# Patient Record
Sex: Female | Born: 1970 | Race: White | Hispanic: No | Marital: Married | Smoking: Current every day smoker
Health system: Southern US, Community
[De-identification: ages and names within clinical notes are randomized; demographics above are authoritative.]

## PROBLEM LIST (undated history)

## (undated) DIAGNOSIS — H539 Unspecified visual disturbance: Secondary | ICD-10-CM

## (undated) DIAGNOSIS — J45909 Unspecified asthma, uncomplicated: Secondary | ICD-10-CM

## (undated) DIAGNOSIS — R55 Syncope and collapse: Secondary | ICD-10-CM

## (undated) HISTORY — PX: DILATION AND CURETTAGE OF UTERUS: SHX78

## (undated) HISTORY — DX: Unspecified visual disturbance: H53.9

## (undated) HISTORY — PX: TUBAL LIGATION: SHX77

## (undated) HISTORY — DX: Syncope and collapse: R55

---

## 2016-05-04 ENCOUNTER — Encounter (HOSPITAL_BASED_OUTPATIENT_CLINIC_OR_DEPARTMENT_OTHER): Payer: Self-pay

## 2016-05-04 ENCOUNTER — Emergency Department (HOSPITAL_BASED_OUTPATIENT_CLINIC_OR_DEPARTMENT_OTHER): Payer: Self-pay

## 2016-05-04 ENCOUNTER — Emergency Department (HOSPITAL_BASED_OUTPATIENT_CLINIC_OR_DEPARTMENT_OTHER)
Admission: EM | Admit: 2016-05-04 | Discharge: 2016-05-05 | Disposition: A | Discharge status: Home / Self Care | Payer: Self-pay | Attending: Emergency Medicine | Admitting: Emergency Medicine

## 2016-05-04 DIAGNOSIS — R079 Chest pain, unspecified: Secondary | ICD-10-CM | POA: Insufficient documentation

## 2016-05-04 DIAGNOSIS — J45909 Unspecified asthma, uncomplicated: Secondary | ICD-10-CM | POA: Insufficient documentation

## 2016-05-04 DIAGNOSIS — F17210 Nicotine dependence, cigarettes, uncomplicated: Secondary | ICD-10-CM | POA: Insufficient documentation

## 2016-05-04 DIAGNOSIS — Z79899 Other long term (current) drug therapy: Secondary | ICD-10-CM | POA: Insufficient documentation

## 2016-05-04 HISTORY — DX: Unspecified asthma, uncomplicated: J45.909

## 2016-05-04 LAB — BASIC METABOLIC PANEL
ANION GAP: 6 (ref 5–15)
BUN: 7 mg/dL (ref 6–20)
CHLORIDE: 106 mmol/L (ref 101–111)
CO2: 26 mmol/L (ref 22–32)
Calcium: 9 mg/dL (ref 8.9–10.3)
Creatinine, Ser: 0.91 mg/dL (ref 0.44–1.00)
GFR calc Af Amer: >60 mL/min (ref >60)
Glucose, Bld: 104 mg/dL — ABNORMAL HIGH (ref 65–99)
POTASSIUM: 3.8 mmol/L (ref 3.5–5.1)
SODIUM: 138 mmol/L (ref 135–145)

## 2016-05-04 LAB — CBC
HEMATOCRIT: 39.2 % (ref 36.0–46.0)
HEMOGLOBIN: 13.3 g/dL (ref 12.0–15.0)
MCH: 32.8 pg (ref 26.0–34.0)
MCHC: 33.9 g/dL (ref 30.0–36.0)
MCV: 96.8 fL (ref 78.0–100.0)
Platelets: 274 10*3/uL (ref 150–400)
RBC: 4.05 MIL/uL (ref 3.87–5.11)
RDW: 12.6 % (ref 11.5–15.5)
WBC: 6.1 10*3/uL (ref 4.0–10.5)

## 2016-05-04 LAB — TROPONIN I: Troponin I: <0.03 ng/mL (ref <0.03)

## 2016-05-04 LAB — PREGNANCY, URINE: PREG TEST UR: NEGATIVE

## 2016-05-04 MED ORDER — IOPAMIDOL (ISOVUE-370) INJECTION 76%
100.0000 mL | Freq: Once | INTRAVENOUS | Status: AC | PRN
  Administered 2016-05-04: 100 mL via INTRAVENOUS

## 2016-05-04 MED ORDER — SODIUM CHLORIDE 0.9 % IV BOLUS (SEPSIS)
1000.0000 mL | Freq: Once | INTRAVENOUS | Status: AC
  Administered 2016-05-04: 1000 mL via INTRAVENOUS

## 2016-05-04 NOTE — ED Notes (Signed)
Mother reports that pts pain is coming back at this time. MD made aware. New orders received for repeat EKG at this time.

## 2016-05-04 NOTE — ED Notes (Signed)
L side chest pain started around 430 while cooking dinner, describes pain as non radiating, aching, and burning. Denies SOB, N/V.

## 2016-05-04 NOTE — ED Provider Notes (Signed)
[facility]   CSN: [id number] Arrival date & time: 05/04/16  1904  By signing my name below, I, Hannah Andersen, attest that this documentation has been prepared under the direction and in the presence of Pricilla Loveless, MD. Electronically Signed: Gillis Andersen. Hannah Andersen, ED Scribe. 05/04/16. 7:42 PM.  History   Chief Complaint Chief Complaint  Patient presents with  . Chest Pain    The history is provided by the patient. No language interpreter was used.  Chest Pain  Associated symptoms include shortness of breath. Pertinent negatives include no back pain, no cough, no diaphoresis, no headaches, no nausea, no numbness, no vomiting and no weakness.   HPI Comments: Hannah Andersen is a 45 y.o. female with PMHx of GERD who presents to the Emergency Department complaining of sudden onset, constant, sharp, central chest-pain x 3 hrs. Pain does not radiate but is more present on the left-side of her chest than the right. She reports that she was cooking dinner when pain started. Pt has associated shortness of breath and slurred speech which lasted for . Shortness of breath has partially resolved while in [facility]. She states that neck pain was present but now has resolved. She has taken Ibuprofen for pain PTA. Pain is exacerbated upon applying pressure to her chest. Pt is a current smoker but has not smoked any cigarettes today because she has had "trouble breathing all day." She reports that her grandmother had an MI in her mid 31's. Pt notes that menstrual ended on 05/04/16 and was normal. Denies any headache, cough, back pain, arm pain, diaphoresis, leg pain, numbness, leg swelling, nausea, or vomiting,  Past Medical History:  Diagnosis Date  . Asthma     There are no active problems to display for this patient.   Past Surgical History:  Procedure Laterality Date  . DILATION AND CURETTAGE OF UTERUS    . TUBAL LIGATION      OB History    No data  available       Home Medications    Prior to Admission medications   Medication Sig Start Date End Date Taking? Authorizing Provider  Omeprazole (PRILOSEC PO) Take by mouth.   Yes Historical Provider, MD    Family History No family history on file.  Social History Social History  Substance Use Topics  . Smoking status: Current Every Day Smoker    Types: Cigarettes  . Smokeless tobacco: Never Used  . Alcohol use No     Allergies   Review of patient's allergies indicates no known allergies.   Review of Systems Review of Systems  Constitutional: Negative for diaphoresis.  Respiratory: Positive for shortness of breath. Negative for cough.   Cardiovascular: Positive for chest pain. Negative for leg swelling.  Gastrointestinal: Negative for nausea and vomiting.  Musculoskeletal: Negative for arthralgias, back pain and myalgias.  Neurological: Positive for speech difficulty. Negative for weakness, numbness and headaches.  All other systems reviewed and are negative.  Physical Exam Updated Vital Signs BP 116/74   Pulse 80   Temp 98.6 F (37 C) (Oral)   Resp 18   Ht 5\' 1"  (1.549 m)   Wt 168 lb (76.2 kg)   LMP 05/01/2016   SpO2 100%   BMI 31.74 kg/m   Physical Exam  Constitutional: She is oriented to person, place, and time. She appears well-developed and well-nourished.  HENT:  Head: Normocephalic and atraumatic.  Right Ear: External ear normal.  Left Ear: External ear normal.  Nose: Nose normal.  Eyes: Right eye exhibits no discharge. Left eye exhibits no discharge.  Cardiovascular: Normal rate, regular rhythm and normal heart sounds.   Pulses:      Radial pulses are 2+ on the right side, and 2+ on the left side.       Dorsalis pedis pulses are 2+ on the right side, and 2+ on the left side.  HR around 100.   Pulmonary/Chest: Effort normal and breath sounds normal. She exhibits tenderness.    Abdominal: Soft. There is no tenderness.  Neurological: She is  alert and oriented to person, place, and time.  No slurred speech. 5/5 strength in all 4 extremities. Grossly normal sensation.   Skin: Skin is warm and dry.  Nursing note and vitals reviewed.  ED Treatments / Results  DIAGNOSTIC STUDIES: Oxygen Saturation is 100% on RA, normal by my interpretation.    COORDINATION OF CARE: 7:36 PM-Discussed treatment plan which includes BMP, CBC, Troponin and IV Fluids with pt at bedside and pt agreed to plan.   Labs (all labs ordered are listed, but only abnormal results are displayed) Labs Reviewed  BASIC METABOLIC PANEL - Abnormal; Notable for the following:       Result Value   Glucose, Bld 104 (*)    All other components within normal limits  CBC  TROPONIN I  PREGNANCY, URINE  TROPONIN I   EKG  EKG Interpretation  Date/Time:  Wednesday May 04 2016 19:22:31 EDT Ventricular Rate:  85 PR Interval:    QRS Duration: 84 QT Interval:  368 QTC Calculation: 438 R Axis:   53 Text Interpretation:  Sinus rhythm Normal ECG No old tracing to compare Confirmed by Azelyn Batie MD, Meziah Blasingame [phone]([id number]) on 05/04/2016 7:40:33 PM       EKG Interpretation  Date/Time:  Wednesday May 04 2016 22:51:33 EDT Ventricular Rate:  79 PR Interval:    QRS Duration: 90 QT Interval:  382 QTC Calculation: 438 R Axis:   52 Text Interpretation:  Sinus rhythm Low voltage, precordial leads Abnormal R-wave progression, early transition no acute ST/T changes no significant change since earlier in the day Confirmed by Hannah Carra MD, Hannah Andersen [phone]([id number]) on 05/04/2016 11:11:10 PM       Radiology Dg Chest 2 View  Result Date: 05/04/2016 CLINICAL DATA:  Sudden onset of mid chest pain beginning 4 hours ago. EXAM: CHEST  2 VIEW COMPARISON:  None. FINDINGS: The heart size is normal. The lung volumes are low. No focal airspace disease is present. There is no edema or effusion suggest failure. Mild rightward curvature is present in the mid thoracic spine. IMPRESSION: Negative two view  chest x-ray Electronically Signed   By: Hannah Andersen  Mattern M.D.   On: 05/04/2016 20:07   Ct Angio Chest Aorta W And/or Wo Contrast  Result Date: 05/04/2016 CLINICAL DATA:  Chest pain today EXAM: CT ANGIOGRAPHY CHEST, ABDOMEN AND PELVIS TECHNIQUE: Multidetector CT imaging through the chest, abdomen and pelvis was performed using the standard protocol during bolus administration of intravenous contrast. Multiplanar reconstructed images and MIPs were obtained and reviewed to evaluate the vascular anatomy. CONTRAST:  100 cc Isovue 370 COMPARISON:  None. FINDINGS: CTA CHEST FINDINGS There is no evidence of aortic dissection, aneurysm, or intramural hematoma. Great vessels patent within the confines of the exam. No obvious acute pulmonary thromboembolism. Small mediastinal nodes. No abnormal adenopathy by measurement criteria. No pneumothorax.  No pleural effusion No acute bony deformity. Review of the MIP images confirms the above findings. CTA ABDOMEN AND  PELVIS FINDINGS No evidence of aortic aneurysm or dissection. The aorta is non aneurysmal and patent. Celiac patent SMA patent IMA patent Single renal arteries patent Common, internal, and external iliac arteries patent Proximal femoral artery is patent. Liver, gallbladder, spleen, pancreas, adrenal glands, and kidneys are within normal limits Normal appendix. No focal mass of the colon. No evidence of bowel obstruction. Uterus is prominent without obvious focal mass. Adnexa and bladder are within normal limits. No free-fluid.  No abnormal retroperitoneal adenopathy. No vertebral compression deformity. Review of the MIP images confirms the above findings. IMPRESSION: No of a of aortic dissection. No active cardiopulmonary disease No acute intra-abdominal pathology. Electronically Signed   By: Jolaine Click M.D.   On: 05/04/2016 22:09   Ct Angio Abd/pel W/ And/or W/o  Result Date: 05/04/2016 CLINICAL DATA:  Chest pain today EXAM: CT ANGIOGRAPHY CHEST, ABDOMEN  AND PELVIS TECHNIQUE: Multidetector CT imaging through the chest, abdomen and pelvis was performed using the standard protocol during bolus administration of intravenous contrast. Multiplanar reconstructed images and MIPs were obtained and reviewed to evaluate the vascular anatomy. CONTRAST:  100 cc Isovue 370 COMPARISON:  None. FINDINGS: CTA CHEST FINDINGS There is no evidence of aortic dissection, aneurysm, or intramural hematoma. Great vessels patent within the confines of the exam. No obvious acute pulmonary thromboembolism. Small mediastinal nodes. No abnormal adenopathy by measurement criteria. No pneumothorax.  No pleural effusion No acute bony deformity. Review of the MIP images confirms the above findings. CTA ABDOMEN AND PELVIS FINDINGS No evidence of aortic aneurysm or dissection. The aorta is non aneurysmal and patent. Celiac patent SMA patent IMA patent Single renal arteries patent Common, internal, and external iliac arteries patent Proximal femoral artery is patent. Liver, gallbladder, spleen, pancreas, adrenal glands, and kidneys are within normal limits Normal appendix. No focal mass of the colon. No evidence of bowel obstruction. Uterus is prominent without obvious focal mass. Adnexa and bladder are within normal limits. No free-fluid.  No abnormal retroperitoneal adenopathy. No vertebral compression deformity. Review of the MIP images confirms the above findings. IMPRESSION: No of a of aortic dissection. No active cardiopulmonary disease No acute intra-abdominal pathology. Electronically Signed   By: Jolaine Click M.D.   On: 05/04/2016 22:09    Procedures Procedures (including critical care time)  Medications Ordered in ED Medications  sodium chloride 0.9 % bolus 1,000 mL (0 mLs Intravenous Stopped 05/04/16 2057)  iopamidol (ISOVUE-370) 76 % injection 100 mL (100 mLs Intravenous Contrast Given 05/04/16 2142)   Initial Impression / Assessment and Plan / ED Course  I have reviewed the  triage vital signs and the nursing notes.  Pertinent labs & imaging results that were available during my care of the patient were reviewed by me and considered in my medical decision making (see chart for details).  Clinical Course    Patient persists with atypical, sharp chest left of midline chest pain. I this is likely musculoskeletal. However, she did present with transient slurred speech in the setting of her chest pain. Probably this was more anxiety related as she was very anxious on arrival. However she's not currently slurred. There is no dissection on CT scan. Has 2 negative troponins and 2 benign ECGs. Has declined pain medicine including ibuprofen in the ED. I have recommended she is ibuprofen at home and follow-up close it with her PCP. Her HEART score is a 1. Discussed return precautions.  Final Clinical Impressions(s) / ED Diagnoses   Final diagnoses:  Nonspecific chest pain  New Prescriptions New Prescriptions   No medications on file   I personally performed the services described in this documentation, which was scribed in my presence. The recorded information has been reviewed and is accurate.     Pricilla Loveless, MD 05/04/16 2330

## 2016-05-04 NOTE — ED Triage Notes (Signed)
C/o CP, SOB x 1.5 hours-presents to triage in w/c-NAD

## 2016-06-18 DIAGNOSIS — R930 Abnormal findings on diagnostic imaging of skull and head, not elsewhere classified: Secondary | ICD-10-CM | POA: Insufficient documentation

## 2016-06-18 DIAGNOSIS — R55 Syncope and collapse: Secondary | ICD-10-CM | POA: Insufficient documentation

## 2016-06-18 DIAGNOSIS — R51 Headache: Secondary | ICD-10-CM

## 2016-06-18 DIAGNOSIS — R519 Headache, unspecified: Secondary | ICD-10-CM | POA: Insufficient documentation

## 2016-07-19 ENCOUNTER — Other Ambulatory Visit: Payer: Self-pay | Admitting: *Deleted

## 2016-07-19 ENCOUNTER — Ambulatory Visit (INDEPENDENT_AMBULATORY_CARE_PROVIDER_SITE_OTHER): Payer: Self-pay | Admitting: Neurology

## 2016-07-19 ENCOUNTER — Encounter: Payer: Self-pay | Admitting: Neurology

## 2016-07-19 DIAGNOSIS — R9089 Other abnormal findings on diagnostic imaging of central nervous system: Secondary | ICD-10-CM | POA: Insufficient documentation

## 2016-07-19 DIAGNOSIS — R269 Unspecified abnormalities of gait and mobility: Secondary | ICD-10-CM | POA: Insufficient documentation

## 2016-07-19 DIAGNOSIS — M542 Cervicalgia: Secondary | ICD-10-CM

## 2016-07-19 DIAGNOSIS — R42 Dizziness and giddiness: Secondary | ICD-10-CM

## 2016-07-19 DIAGNOSIS — G4489 Other headache syndrome: Secondary | ICD-10-CM | POA: Insufficient documentation

## 2016-07-19 DIAGNOSIS — G4452 New daily persistent headache (NDPH): Secondary | ICD-10-CM

## 2016-07-19 DIAGNOSIS — F418 Other specified anxiety disorders: Secondary | ICD-10-CM

## 2016-07-19 MED ORDER — ESCITALOPRAM OXALATE 20 MG PO TABS: 20.0000 mg | ORAL_TABLET | Freq: Every day | ORAL | 5 refills | Status: DC

## 2016-07-19 MED ORDER — VALSARTAN 80 MG PO TABS: 80.0000 mg | ORAL_TABLET | Freq: Every day | ORAL | 1 refills | Status: DC

## 2016-07-19 NOTE — Progress Notes (Addendum)
[facility]  PATIENT: Hannah Andersen DOB: [birth year]  REFERRING DOCTOR OR PCP:  Dr. Jobe Marker SOURCE: patient, notes from ED/Dr. Trudie Buckler, studies at [facility]  _________________________________   HISTORICAL  CHIEF COMPLAINT:  Chief Complaint  Patient presents with  . Abnormal MRI    Hannah Andersen is here with her husband Dimas Aguas for eval of abnorma MRI.  Sts. about a month ago, she had a synopal episode while walking in her yard.  She was seen at [facility], where she sts. MRI brain showed a lesion.  "They told me they believed it was MS."  Sts. she has had intermittent dizziness since then, with h/a's. Also sts. she has a burning sensation scalp back of head/fim  . Dizziness  . Headaches    HISTORY OF PRESENT ILLNESS:  I had the pleasure seeing you patient, Hannah Andersen, at [facility] for neurologic consultation regarding her recent logic symptoms and her abnormal MRI and possibility of multiple sclerosis.  She is a 45 year old woman who had an episode of syncope that came on suddenly one month ago. At that time, she was experiencing a headache. The syncope was not preceded by lightheadedness.   HEENT to what witnesses have told her, she was unconscious for about 1 minute and then was groggy or not quite herself for the next 5 minutes. By the time she got to [facility] she felt that she was back to herself. In the emergency room, she had a head CT scan showing a hypodense focus in the right periatrial white matter. As admitted for further evaluation. An MRI of the brain showed that focus plus many others, some in the periventricular white matter. None of the foci enhanced after contrast. She also had an EEG that did not show any definite epileptiform activity.  In the past, she has had problems with headaches. These have been almost daily for the past 3 months. However, her headaches have been at least  intermittent since her mid 16s. The current more chronic headache is located in the temple region and the back of the head bilaterally. Generally, she wakes up without a headache in the headache builds up as the day goes on.  Over-the-counter medications like Excedrin Migraine take the edge off for an hour or 2 but then the headache returns to the same or worse intensity. Most of time, she will get nausea with the headaches and every now and then she will have vomiting.  She denies any difficulty with her gait. She has not noted problems with strength or weakness. In the past, she has not had episodes where there was reduced strength, sensation or coordination.   Note that she is generally clumsy but this is been a problem for many years.  She notes that she needs to wear reading glasses now. Sometimes her vision is blurry. However, there has not been an episode of time where one eye was significantly worse than the other for days a weeks.  She notes occasional urinary urgency and frequency but has not had incontinence. She has nocturia only once a night.  She notes that she gets tired more easily over the last year than previous years. She functions a little bit better in warm weather and cold weather. She denies any significant insomnia and goes to bed typically around 11 PM and wakes up typically around 5 AM. He does not nap.  Her husband both note that she has depression and anxiety.    She notes decreased  short-term memory and decreased focus and attention. These have been going on for a long time.  She has several vascular risk factors including essential hypertension and smoking.  I reviewed hospital records from October:    I personally reviewed the MRI of the brain and note one large right periatrial white matter focus, many other periventricular foci one subtle posterior pons on the left.  None of the foci enhanced. CT scan of the head showed the right periatrial focus. Echocardiogram showed  moderate mitral regurgitation and normal ejection fraction carotid Dopplers were normal. EEG showed no epileptiform activity though there were some transient temporal slow waves (nonspecific).     Her maternal grandparents had CAD and her grandmother had Alzheimer's disease    REVIEW OF SYSTEMS: Constitutional: No fevers, chills, sweats, or change in appetite Eyes: No visual changes, double vision, eye pain Ear, nose and throat: No hearing loss, ear pain, nasal congestion, sore throat Cardiovascular: No chest pain, palpitations Respiratory: No shortness of breath at rest or with exertion.   No wheezes GastrointestinaI: No nausea, vomiting, diarrhea, abdominal pain, fecal incontinence Genitourinary: No dysuria, urinary retention or frequency.  No nocturia. Musculoskeletal: No neck pain, back pain Integumentary: No rash, pruritus, skin lesions Neurological: as above Psychiatric: No depression at this time.  No anxiety Endocrine: No palpitations, diaphoresis, change in appetite, change in weigh or increased thirst Hematologic/Lymphatic: No anemia, purpura, petechiae. Allergic/Immunologic: No itchy/runny eyes, nasal congestion, recent allergic reactions, rashes  ALLERGIES: No Known Allergies  HOME MEDICATIONS:  Current Outpatient Prescriptions:  .  aspirin EC 81 MG tablet, Take 81 mg by mouth., Disp: , Rfl:  .  butalbital-acetaminophen-caffeine (FIORICET, ESGIC) 50-325-40 MG tablet, Take by mouth., Disp: , Rfl:  .  cyanocobalamin (TH VITAMIN B12) 100 MCG tablet, Take by mouth., Disp: , Rfl:  .  omeprazole (PRILOSEC) 10 MG capsule, Take by mouth., Disp: , Rfl:  .  escitalopram (LEXAPRO) 20 MG tablet, Take 1 tablet (20 mg total) by mouth daily., Disp: 30 tablet, Rfl: 5 .  valsartan (DIOVAN) 80 MG tablet, Take 1 tablet (80 mg total) by mouth daily., Disp: 30 tablet, Rfl: 1  PAST MEDICAL HISTORY: Past Medical History:  Diagnosis Date  . Asthma   . Syncope and collapse   . Vision  abnormalities     PAST SURGICAL HISTORY: Past Surgical History:  Procedure Laterality Date  . DILATION AND CURETTAGE OF UTERUS    . TUBAL LIGATION      FAMILY HISTORY: Family History  Problem Relation Age of Onset  . Diabetes Mellitus II Mother   . Arrhythmia Mother   . Diabetes type I Father   . COPD Father   . Obstructive Sleep Apnea Father   . Depression Father   . GER disease Father     SOCIAL HISTORY:  Social History   Social History  . Marital status: Married    Spouse name: N/A  . Number of children: N/A  . Years of education: N/A   Occupational History  . Not on file.   Social History Main Topics  . Smoking status: Current Every Day Smoker    Types: Cigarettes  . Smokeless tobacco: Never Used  . Alcohol use No  . Drug use: No  . Sexual activity: Not on file   Other Topics Concern  . Not on file   Social History Narrative  . No narrative on file     PHYSICAL EXAM  Vitals:   07/19/16 0855  BP: (!) 184/104  Pulse: 92  Resp: 18  Weight: 179 lb (81.2 kg)  Height: 5\' 1"  (1.549 m)    Body mass index is 33.82 kg/m.   General: The patient is well-developed and well-nourished and in no acute distress  Eyes:  Funduscopic exam shows normal optic discs and retinal vessels.  Neck: The neck is supple, no carotid bruits are noted.  The neck is Tender at the occiput, right greater than left. Lifting the head reduces her headache..  Cardiovascular: The heart has a regular rate and rhythm with a normal S1 and S2. She has a 2/6  Murmur.   No gallop or rubs. Lungs are clear to auscultation.  Skin: Extremities are without significant edema.  Musculoskeletal:  Back is nontender  Neurologic Exam  Mental status: The patient is alert and oriented x 3 at the time of the examination. The patient has apparent normal recent and remote memory, with an apparently normal attention span and concentration ability.   Speech is normal.  Cranial nerves: Extraocular  movements are full. Pupils are equal, round, and reactive to light and accomodation.  Visual fields are full.  Facial symmetry is present. There is good facial sensation to soft touch bilaterally.Facial strength is normal.  Trapezius and sternocleidomastoid strength is normal. No dysarthria is noted.  The tongue is midline, and the patient has symmetric elevation of the soft palate. No obvious hearing deficits are noted.  Motor:  Muscle bulk is normal.   Tone is normal. Strength is  5 / 5 in all 4 extremities.   Sensory: Sensory testing is intact to pinprick, soft touch and vibration sensation in all 4 extremities.  Coordination: Cerebellar testing reveals good finger-nose-finger and heel-to-shin bilaterally.  Gait and station: Station is normal.   Gait is normal. Tandem gait is mildly wide. Romberg is negative.   Reflexes: Deep tendon reflexes are symmetric and normal bilaterally.   Plantar responses are flexor.    DIAGNOSTIC DATA (LABS, IMAGING, TESTING) - I reviewed patient records, labs, notes, testing and imaging myself where available.  Lab Results  Component Value Date   WBC 6.1 05/04/2016   HGB 13.3 05/04/2016   HCT 39.2 05/04/2016   MCV 96.8 05/04/2016   PLT 274 05/04/2016      Component Value Date/Time   NA 138 05/04/2016 1930   K 3.8 05/04/2016 1930   CL 106 05/04/2016 1930   CO2 26 05/04/2016 1930   GLUCOSE 104 (H) 05/04/2016 1930   BUN 7 05/04/2016 1930   CREATININE 0.91 05/04/2016 1930   CALCIUM 9.0 05/04/2016 1930   GFRNONAA >60 05/04/2016 1930   GFRAA >60 05/04/2016 1930       ASSESSMENT AND PLAN  Abnormal finding on MRI of brain - Plan: Sedimentation rate, ANA w/Reflex, Pan-ANCA, Lupus anticoagulant, Homocysteine, Lipid Panel, MR CERVICAL SPINE WO CONTRAST  Gait disturbance - Plan: MR CERVICAL SPINE WO CONTRAST  Depression with anxiety  Neck pain  Other headache syndrome   In summary, Hannah Andersen is a 45 year old woman with hypertension and  tobacco use who presented to the hospital after an episode of syncope and was found to have an abnormal MRI potentially worrisome for multiple sclerosis. MS could certainly lead to an MRI that she has failed many of the foci are nonspecific and I can't rule out that this could be due to chronic ischemic changes.    This needs to be further evaluated and I have set up a lumbar puncture to evaluate the CSF for oligoclonal bands and  IgG index. Additionally, we will check blood work for vasculitis and metabolic issues. We also need to evaluate an MRI of the cervical spine to determine if her mild gait disturbance could be due to a demyelinating plaque or other myelopathy.  Based on the results of CSF analysis and cervical spine MRI, further evaluation treatment may be necessary.  She also has occipital tenderness and a chronic daily headache.  A bilateral splenius capitis trigger point injection was performed with 80 mg Depo-Medrol in Marcaine using sterile technique. She tolerated the procedure well and pain was much better 5 minutes later.  She has other symptoms including depression with anxiety and blood pressure was very elevated today at 180/100.    I will go ahead and start escitalopram and Diovan 80 mg but have advised her to see her primary care doctor within a week if possible.  She will return to see me in 6-8 weeks or sooner if there are new or worsening symptoms   Zaydin Billey A. Epimenio Foot, MD, PhD 07/19/2016, 10:00 AM Certified in Neurology, Clinical Neurophysiology, Sleep Medicine, Pain Medicine and Neuroimaging  [facility] [address], [address] [place], [place] [address] [phone]

## 2016-07-20 LAB — ENA+DNA/DS+SJORGEN'S
ENA RNP AB: 6.9 AI — AB (ref 0.0–0.9)
ENA SSA (RO) Ab: <0.2 AI (ref 0.0–0.9)
ENA SSB (LA) Ab: <0.2 AI (ref 0.0–0.9)
dsDNA Ab: 3 IU/mL (ref 0–9)

## 2016-07-20 LAB — LIPID PANEL
CHOL/HDL RATIO: 5.3 ratio — AB (ref 0.0–4.4)
Cholesterol, Total: 211 mg/dL — ABNORMAL HIGH (ref 100–199)
HDL: 40 mg/dL (ref >39)
LDL Calculated: 136 mg/dL — ABNORMAL HIGH (ref 0–99)
TRIGLYCERIDES: 173 mg/dL — AB (ref 0–149)
VLDL Cholesterol Cal: 35 mg/dL (ref 5–40)

## 2016-07-20 LAB — LUPUS ANTICOAGULANT
DPT CONFIRM RATIO: 1.23 ratio (ref 0.00–1.40)
DRVVT: 34.1 s (ref 0.0–47.0)
PTT Lupus Anticoagulant: 31.1 s (ref 0.0–51.9)
Thrombin Time: 20.4 s (ref 0.0–23.0)
dPT: 47.8 s (ref 0.0–55.0)

## 2016-07-20 LAB — PAN-ANCA
ANCA Proteinase 3: <3.5 U/mL (ref 0.0–3.5)
Atypical pANCA: <1:20 {titer}

## 2016-07-20 LAB — SEDIMENTATION RATE: SED RATE: 2 mm/h (ref 0–32)

## 2016-07-20 LAB — ANA W/REFLEX: ANA: POSITIVE — AB

## 2016-07-20 LAB — HOMOCYSTEINE: HOMOCYSTEINE: 27 umol/L — AB (ref 0.0–15.0)

## 2016-07-25 ENCOUNTER — Telehealth: Payer: Self-pay | Admitting: *Deleted

## 2016-07-25 MED ORDER — FOLIC ACID-VIT B6-VIT B12 2.5-25-1 MG PO TABS: 1.0000 | ORAL_TABLET | Freq: Every day | ORAL | 0 refills | Status: DC

## 2016-07-25 MED ORDER — ATORVASTATIN CALCIUM 10 MG PO TABS: 10.0000 mg | ORAL_TABLET | Freq: Every day | ORAL | 11 refills | Status: AC

## 2016-07-25 NOTE — Telephone Encounter (Signed)
-----   Message from Richard A Sater, MD sent at 07/22/2016  1:47 PM EST ----- Please let her know that the one test (homocystine) was elevated. This can affect blood vessels so I want her to take Foltx by mouth daily #30 # 11 and we will recheck in 6 months or so. Another test (ANA) was a little off but this is less likely to be significant.  Her lipid panel was also mildly elevated and I want her to start Lipitor 10 mg by mouth daily (#30  #11) and we will recheck at her next visit. 

## 2016-07-25 NOTE — Telephone Encounter (Signed)
LMTC./fim 

## 2016-07-25 NOTE — Telephone Encounter (Signed)
I have spoken with Hannah Andersen this morning, and per RAS, reviewed lab results with her.  Explained RAS would like her to start Foltx and Lipitor.  She is agreeable.  [facility] per her request.  She also sts. she had to go to the ER over the weekend, after starting Diovan and Lexapro.  She had hives, some difficulty breathing. Diovan and Lexapro were d/c and she started Prednisone.  Hives are resolving.  will update RAS when he returns to the office tomorrow and call pt. back with changes to tx. plan/fim

## 2016-07-25 NOTE — Telephone Encounter (Signed)
-----   Message from Asa Lente, MD sent at 07/22/2016  1:47 PM EST ----- Please let her know that the one test (homocystine) was elevated. This can affect blood vessels so I want her to take Foltx by mouth daily #30 # 11 and we will recheck in 6 months or so. Another test (ANA) was a little off but this is less likely to be significant.  Her lipid panel was also mildly elevated and I want her to start Lipitor 10 mg by mouth daily (#30  #11) and we will recheck at her next visit.

## 2016-07-26 NOTE — Telephone Encounter (Signed)
We can try a different antidepressant medication. Please call in sertraline 50 mg 1 by mouth daily #30 #5  She can discuss BP meds with PCP

## 2016-07-27 ENCOUNTER — Telehealth: Payer: Self-pay | Admitting: *Deleted

## 2016-07-27 MED ORDER — SERTRALINE HCL 50 MG PO TABS: 50.0000 mg | ORAL_TABLET | Freq: Every day | ORAL | 5 refills | Status: DC

## 2016-07-27 NOTE — Telephone Encounter (Signed)
See telephone note/fim 

## 2016-07-27 NOTE — Telephone Encounter (Signed)
I have spoken with Hannah Andersen this morning and per RAS, advised she may take Zoloft 50mg  po qd for depression, should discuss BP med with PCP.  She is agreeable with this plan.  Zoloft rx. escribed to [facility] per her request/fim

## 2016-07-27 NOTE — Addendum Note (Signed)
Addended by: Candis Schatz I on: 07/27/2016 09:32 AM   Modules accepted: Orders

## 2016-07-27 NOTE — Telephone Encounter (Signed)
LMTC./fim 

## 2016-07-27 NOTE — Telephone Encounter (Signed)
-----   Message from Richard A Sater, MD sent at 07/22/2016  1:47 PM EST ----- Please let her know that the one test (homocystine) was elevated. This can affect blood vessels so I want her to take Foltx by mouth daily #30 # 11 and we will recheck in 6 months or so. Another test (ANA) was a little off but this is less likely to be significant.  Her lipid panel was also mildly elevated and I want her to start Lipitor 10 mg by mouth daily (#30  #11) and we will recheck at her next visit. 

## 2016-08-22 ENCOUNTER — Other Ambulatory Visit: Payer: Self-pay | Admitting: Neurology

## 2016-10-01 ENCOUNTER — Other Ambulatory Visit: Payer: Self-pay | Admitting: Neurology

## 2016-10-05 ENCOUNTER — Other Ambulatory Visit: Payer: Self-pay | Admitting: Neurology

## 2017-01-09 ENCOUNTER — Telehealth: Payer: Self-pay | Admitting: Neurology

## 2017-01-09 NOTE — Telephone Encounter (Signed)
Patient called office in reference to LP that was ordered last November patient states she was unable to have done and would like to know if we can resend order to GI.  Please call

## 2017-01-10 NOTE — Telephone Encounter (Signed)
Spoke with pt. and gave appt. with RAS 01/18/17/fim

## 2017-01-18 ENCOUNTER — Ambulatory Visit (INDEPENDENT_AMBULATORY_CARE_PROVIDER_SITE_OTHER): Payer: BLUE CROSS/BLUE SHIELD | Admitting: Neurology

## 2017-01-18 ENCOUNTER — Encounter: Payer: Self-pay | Admitting: Neurology

## 2017-01-18 ENCOUNTER — Encounter (INDEPENDENT_AMBULATORY_CARE_PROVIDER_SITE_OTHER): Payer: Self-pay

## 2017-01-18 VITALS — BP 132/83 | HR 102 | Resp 18 | Ht 61.0 in | Wt 182.0 lb

## 2017-01-18 DIAGNOSIS — M542 Cervicalgia: Secondary | ICD-10-CM

## 2017-01-18 DIAGNOSIS — R269 Unspecified abnormalities of gait and mobility: Secondary | ICD-10-CM | POA: Diagnosis not present

## 2017-01-18 DIAGNOSIS — R292 Abnormal reflex: Secondary | ICD-10-CM

## 2017-01-18 DIAGNOSIS — R51 Headache: Secondary | ICD-10-CM

## 2017-01-18 DIAGNOSIS — R55 Syncope and collapse: Secondary | ICD-10-CM | POA: Diagnosis not present

## 2017-01-18 DIAGNOSIS — R519 Headache, unspecified: Secondary | ICD-10-CM

## 2017-01-18 DIAGNOSIS — R9089 Other abnormal findings on diagnostic imaging of central nervous system: Secondary | ICD-10-CM | POA: Diagnosis not present

## 2017-01-18 MED ORDER — SUMATRIPTAN SUCCINATE 100 MG PO TABS: 100.0000 mg | ORAL_TABLET | Freq: Once | ORAL | 2 refills | Status: DC | PRN

## 2017-01-18 MED ORDER — IMIPRAMINE HCL 25 MG PO TABS: 25.0000 mg | ORAL_TABLET | Freq: Every day | ORAL | 5 refills | Status: DC

## 2017-01-18 NOTE — Progress Notes (Addendum)
[facility]  PATIENT: Hannah Andersen DOB: Aug 16, 1971  REFERRING DOCTOR OR PCP:  Dr. Jobe Marker SOURCE: patient, notes from ED/Dr. Trudie Buckler, studies at [facility]  _________________________________   HISTORICAL  CHIEF COMPLAINT:  Chief Complaint  Patient presents with  . Abnormal MRI brain    Sts. she never had LP that was ordered last November--doesn't remember getting a phone call to schedule it.  Would like to discuss re-ordering LP/fim    HISTORY OF PRESENT ILLNESS:  Hannah Andersen is a 46 year old woman who had an episode of syncope in 2017 and an abnormal MRI concerning for MS.  HA:   Headaches have worsened again. They are in the temples and a little further back, sometimes to the occiput.  Most of time, she will get nausea with the headaches and every now and then she will have vomiting.   Bright lights, heat and stress worsen the headache.   Moving increases pain.    Bending over especially increases pain.   She does not get much benefit from NSAIDs. Fioricet made her sleepy but did not help pain.   Ice packs help some.    Gait/strength/sensation:  She denies any difficulty with her gait in general but sometimes she igets off balanced and weaves a bit.   She has not noted problems with strength or weakness. In the past, she has not had episodes where there was reduced strength or coordination.   Note that she is generally clumsy but this is been a problem for many years.    She gets some hand numbness bilaterally.   Vision:  She notes that she needs to wear reading glasses now.    Bladder:  She notes occasional urinary urgency and frequency but has not had incontinence. She has nocturia only once a night.  Fatigue/sleep:   She notes fatigue as before, worse when she gets a headache,   She has insomnia, especially with sleep maintenance,     Mood: Depression and anxiety is better on sertraline. She tolerates it well.  She has  several vascular risk factors including essential hypertension, smoking and elevated homocysteine.  History of abnl MRI and syncope:  She was experiencing a headache and then had LOC.  The syncope was not preceded by lightheadedness.   HEENT to what witnesses have told her, she was unconscious for about 1 minute and then was groggy or not quite herself for the next 5 minutes. By the time she got to [facility] she felt that she was back to herself. In the emergency room, she had a head CT scan showing a hypodense focus in the right periatrial white matter. As admitted for further evaluation. An MRI of the brain showed that focus plus many others, some in the periventricular white matter. None of the foci enhanced after contrast. She also had an EEG that did not show any definite epileptiform activity.  Data:    At the initial consultation, I reviewed hospital records from October:    I personally reviewed the MRI of the brain and note one large right periatrial white matter focus, many other periventricular foci one subtle posterior pons on the left.  None of the foci enhanced. CT scan of the head showed the right periatrial focus. Echocardiogram showed moderate mitral regurgitation and normal ejection fraction carotid Dopplers were normal. EEG showed no epileptiform activity though there were some transient temporal slow waves (nonspecific).      REVIEW OF SYSTEMS: Constitutional: No fevers, chills, sweats,  or change in appetite Eyes: No visual changes, double vision, eye pain Ear, nose and throat: No hearing loss, ear pain, nasal congestion, sore throat Cardiovascular: No chest pain, palpitations Respiratory: No shortness of breath at rest or with exertion.   No wheezes GastrointestinaI: No nausea, vomiting, diarrhea, abdominal pain, fecal incontinence Genitourinary: No dysuria, urinary retention or frequency.  No nocturia. Musculoskeletal: No neck pain, back pain Integumentary:  No rash, pruritus, skin lesions Neurological: as above Psychiatric: No depression at this time.  No anxiety Endocrine: No palpitations, diaphoresis, change in appetite, change in weigh or increased thirst Hematologic/Lymphatic: No anemia, purpura, petechiae. Allergic/Immunologic: No itchy/runny eyes, nasal congestion, recent allergic reactions, rashes  ALLERGIES: No Known Allergies  HOME MEDICATIONS:  Current Outpatient Prescriptions:  .  atorvastatin (LIPITOR) 10 MG tablet, Take 1 tablet (10 mg total) by mouth daily., Disp: 30 tablet, Rfl: 11 .  butalbital-acetaminophen-caffeine (FIORICET, ESGIC) 50-325-40 MG tablet, Take by mouth., Disp: , Rfl:  .  cyanocobalamin (TH VITAMIN B12) 100 MCG tablet, Take by mouth., Disp: , Rfl:  .  escitalopram (LEXAPRO) 20 MG tablet, Take 1 tablet (20 mg total) by mouth daily., Disp: 30 tablet, Rfl: 5 .  FOLBEE 2.5-25-1 MG TABS tablet, TAKE ONE TABLET BY MOUTH ONCE DAILY, Disp: 30 tablet, Rfl: 11 .  omeprazole (PRILOSEC) 10 MG capsule, Take by mouth., Disp: , Rfl:  .  sertraline (ZOLOFT) 50 MG tablet, Take 1 tablet (50 mg total) by mouth daily., Disp: 30 tablet, Rfl: 5 .  valsartan (DIOVAN) 80 MG tablet, TAKE ONE TABLET BY MOUTH ONCE DAILY, Disp: 30 tablet, Rfl: 1 .  imipramine (TOFRANIL) 25 MG tablet, Take 1 tablet (25 mg total) by mouth at bedtime., Disp: 30 tablet, Rfl: 5 .  SUMAtriptan (IMITREX) 100 MG tablet, Take 1 tablet (100 mg total) by mouth once as needed for migraine. May repeat in 2 hours if headache persists or recurs., Disp: 10 tablet, Rfl: 2  PAST MEDICAL HISTORY: Past Medical History:  Diagnosis Date  . Asthma   . Syncope and collapse   . Vision abnormalities     PAST SURGICAL HISTORY: Past Surgical History:  Procedure Laterality Date  . DILATION AND CURETTAGE OF UTERUS    . TUBAL LIGATION      FAMILY HISTORY: Family History  Problem Relation Age of Onset  . Diabetes Mellitus II Mother   . Arrhythmia Mother   . Diabetes  type I Father   . COPD Father   . Obstructive Sleep Apnea Father   . Depression Father   . GER disease Father     SOCIAL HISTORY:  Social History   Social History  . Marital status: Married    Spouse name: N/A  . Number of children: N/A  . Years of education: N/A   Occupational History  . Not on file.   Social History Main Topics  . Smoking status: Current Every Day Smoker    Types: Cigarettes  . Smokeless tobacco: Never Used  . Alcohol use No  . Drug use: No  . Sexual activity: Not on file   Other Topics Concern  . Not on file   Social History Narrative  . No narrative on file     PHYSICAL EXAM  Vitals:   01/18/17 1518  BP: 132/83  Pulse: (!) 102  Resp: 18  Weight: 182 lb (82.6 kg)  Height: 5\' 1"  (1.549 m)    Body mass index is 34.39 kg/m.   General: The patient is well-developed and well-nourished  and in no acute distress   Neck:    The neck is Tender at the occiput, bilaterally.. Lifting the head reduces her headache..   Skin: Extremities are without rash or edema.   Neurologic Exam  Mental status: The patient is alert and oriented x 3 at the time of the examination. The patient has apparent normal recent and remote memory, with an apparently normal attention span and concentration ability.   Speech is normal.  Cranial nerves: Extraocular movements are full.  Facial strength and sensation is normal. Trapezius and sternocleidomastoid strength is normal. No dysarthria is noted.  The tongue is midline, and the patient has symmetric elevation of the soft palate. No obvious hearing deficits are noted.  Motor:  Muscle bulk is normal.   Tone is normal. Strength is  5 / 5 in all 4 extremities.   Sensory: Sensory testing is intact to pinprick, soft touch and vibration sensation in all 4 extremities.  Coordination: Cerebellar testing reveals good finger-nose-finger and heel-to-shin bilaterally.  Gait and station: Station is normal.   Gait is normal.  Tandem gait is mildly wide. Romberg is negative.   Reflexes: Deep tendon reflexes are symmetric and normal in arms but she has 3+ DTRs in legs with spread at the knees.  .   Plantar responses are flexor.    DIAGNOSTIC DATA (LABS, IMAGING, TESTING) - I reviewed patient records, labs, notes, testing and imaging myself where available.  Lab Results  Component Value Date   WBC 6.1 05/04/2016   HGB 13.3 05/04/2016   HCT 39.2 05/04/2016   MCV 96.8 05/04/2016   PLT 274 05/04/2016      Component Value Date/Time   NA 138 05/04/2016 1930   K 3.8 05/04/2016 1930   CL 106 05/04/2016 1930   CO2 26 05/04/2016 1930   GLUCOSE 104 (H) 05/04/2016 1930   BUN 7 05/04/2016 1930   CREATININE 0.91 05/04/2016 1930   CALCIUM 9.0 05/04/2016 1930   GFRNONAA >60 05/04/2016 1930   GFRAA >60 05/04/2016 1930       ASSESSMENT AND PLAN  Abnormal finding on MRI of brain - Plan: MR CERVICAL SPINE WO CONTRAST  Gait disturbance  Chronic intractable headache, unspecified headache type  Neck pain - Plan: MR CERVICAL SPINE WO CONTRAST  Syncope, unspecified syncope type  Hyperreflexia - Plan: MR CERVICAL SPINE WO CONTRAST     1.    A bilateral splenius capitis trigger point injection was performed with 80 mg Depo-Medrol in Marcaine using sterile technique. She tolerated the procedure well and pain was much better 5 minutes later. 2.   Imipramine 25 mg nightly 3.   Continue zoloft and Diovan and Folbee 4.   Imitrex prn  5.   MRI cervical spine to determine if abnl brain MRI and hyper-reflexia may be related to MS or myelopathy..   If normal, consider LP for CSF analysis to r/o MS  She will return to see me in 6-8 weeks or sooner if there are new or worsening symptoms   Salley Boxley A. Epimenio Foot, MD, PhD 01/18/2017, 4:08 PM Certified in Neurology, Clinical Neurophysiology, Sleep Medicine, Pain Medicine and Neuroimaging  [facility] [address] [place], [place] [address] [phone]

## 2017-01-19 ENCOUNTER — Telehealth: Payer: Self-pay | Admitting: Neurology

## 2017-01-19 NOTE — Telephone Encounter (Signed)
I have notified [facility] that per RAS, ok to fill both Sertraline and Imipramine/fim

## 2017-01-19 NOTE — Telephone Encounter (Signed)
[place] with [facility] pharmacy called office in reference to imipramine (TOFRANIL) 25 MG tablet stating there is 1 drug interaction with sertraline (ZOLOFT) 50 MG tablet.  Please call

## 2017-01-25 ENCOUNTER — Other Ambulatory Visit: Payer: BLUE CROSS/BLUE SHIELD

## 2017-02-01 ENCOUNTER — Ambulatory Visit: Payer: BLUE CROSS/BLUE SHIELD

## 2017-02-08 ENCOUNTER — Ambulatory Visit (INDEPENDENT_AMBULATORY_CARE_PROVIDER_SITE_OTHER): Payer: BLUE CROSS/BLUE SHIELD

## 2017-02-08 DIAGNOSIS — R9089 Other abnormal findings on diagnostic imaging of central nervous system: Secondary | ICD-10-CM | POA: Diagnosis not present

## 2017-02-08 DIAGNOSIS — R292 Abnormal reflex: Secondary | ICD-10-CM

## 2017-02-08 DIAGNOSIS — M542 Cervicalgia: Secondary | ICD-10-CM

## 2017-02-15 ENCOUNTER — Telehealth: Payer: Self-pay | Admitting: *Deleted

## 2017-02-15 DIAGNOSIS — R937 Abnormal findings on diagnostic imaging of other parts of musculoskeletal system: Secondary | ICD-10-CM

## 2017-02-15 DIAGNOSIS — R5383 Other fatigue: Secondary | ICD-10-CM

## 2017-02-15 DIAGNOSIS — R9089 Other abnormal findings on diagnostic imaging of central nervous system: Secondary | ICD-10-CM

## 2017-02-15 DIAGNOSIS — R55 Syncope and collapse: Secondary | ICD-10-CM

## 2017-02-15 NOTE — Telephone Encounter (Signed)
LMTC cell #.  Attempted to reach pt. at home # as well, but received message "your call can't be completed at this time; please hang up and try your call again later."/fim

## 2017-02-15 NOTE — Telephone Encounter (Signed)
Patient returning your call.

## 2017-02-15 NOTE — Telephone Encounter (Signed)
-----   Message from Asa Lente, MD sent at 02/14/2017  2:58 PM EDT ----- Please let her know that the MRI of the cervical spine showed one tiny spot. I would like her to go ahead and proceed with a lumbar puncture so we can check the fluid to see if it's consistent with MS (we can schedule at [facility])

## 2017-02-15 NOTE — Telephone Encounter (Signed)
-----   Message from Richard A Sater, MD sent at 02/14/2017  2:58 PM EDT ----- Please let her know that the MRI of the cervical spine showed one tiny spot. I would like her to go ahead and proceed with a lumbar puncture so we can check the fluid to see if it's consistent with MS (we can schedule at [facility]) 

## 2017-02-15 NOTE — Telephone Encounter (Signed)
02/15/17 at 1533 [facility] cell #/fim

## 2017-02-15 NOTE — Telephone Encounter (Signed)
Pt called back for test results.  She is asking to be contacted on her mobile#

## 2017-02-15 NOTE — Telephone Encounter (Signed)
Rx. up front [facility]/fim 

## 2017-02-15 NOTE — Telephone Encounter (Signed)
LMTC cell#/fim 

## 2017-02-16 NOTE — Telephone Encounter (Signed)
Patient called office returning RN's call.  Please call °

## 2017-02-16 NOTE — Telephone Encounter (Signed)
Noted referral faxed to [facility] thanks Faith.

## 2017-02-16 NOTE — Telephone Encounter (Signed)
I have spoken with Hannah Andersen this morning and per RAS, reviewed MRI c/spine results with her.  She verbalized understanding of same; is agreeable with having LP . Orders in EPIC/fim

## 2017-02-16 NOTE — Addendum Note (Signed)
Addended by: Candis Schatz I on: 02/16/2017 11:08 AM   Modules accepted: Orders

## 2017-02-22 ENCOUNTER — Ambulatory Visit
Admission: RE | Admit: 2017-02-22 | Discharge: 2017-02-22 | Disposition: A | Discharge status: Home / Self Care | Payer: BLUE CROSS/BLUE SHIELD | Source: Ambulatory Visit | Attending: Neurology | Admitting: Neurology

## 2017-02-22 DIAGNOSIS — G4489 Other headache syndrome: Secondary | ICD-10-CM

## 2017-02-22 DIAGNOSIS — R269 Unspecified abnormalities of gait and mobility: Secondary | ICD-10-CM

## 2017-02-22 DIAGNOSIS — R55 Syncope and collapse: Secondary | ICD-10-CM

## 2017-02-22 DIAGNOSIS — R930 Abnormal findings on diagnostic imaging of skull and head, not elsewhere classified: Secondary | ICD-10-CM

## 2017-02-22 DIAGNOSIS — R937 Abnormal findings on diagnostic imaging of other parts of musculoskeletal system: Secondary | ICD-10-CM

## 2017-02-22 DIAGNOSIS — R9089 Other abnormal findings on diagnostic imaging of central nervous system: Secondary | ICD-10-CM

## 2017-02-22 DIAGNOSIS — R5383 Other fatigue: Secondary | ICD-10-CM

## 2017-02-22 LAB — CSF CELL COUNT WITH DIFFERENTIAL
RBC COUNT CSF: 1 {cells}/uL (ref 0–10)
WBC, CSF: 3 cells/uL (ref 0–5)

## 2017-02-22 LAB — GLUCOSE, CSF: Glucose, CSF: 62 mg/dL (ref 43–76)

## 2017-02-22 LAB — PROTEIN, CSF: Total Protein, CSF: 42 mg/dL (ref 15–45)

## 2017-02-22 MED ORDER — DIAZEPAM 5 MG PO TABS
5.0000 mg | ORAL_TABLET | Freq: Once | ORAL | Status: AC
  Administered 2017-02-22: 5 mg via ORAL

## 2017-02-22 NOTE — Discharge Instructions (Signed)

## 2017-02-22 NOTE — Progress Notes (Signed)
One SST tube of blood drawn from right AC space for LP labs; site unremarkable.  Remington Skalsky, RN 

## 2017-02-23 LAB — CNS IGG SYNTHESIS RATE, CSF+BLOOD
ALBUMIN CSF: 20.8 mg/dL (ref 8.0–42.0)
Albumin, Serum(Neph): 3.8 g/dL (ref 3.5–5.2)
IGG INDEX, CSF: 1.03 — AB (ref <0.66)
IGG, CSF: 5.1 mg/dL (ref 0.8–7.7)
IGG, SERUM: 903 mg/dL (ref 694–1618)
MS CNS IGG SYNTHESIS RATE: 11.1 mg/(24.h) — AB (ref <=3.3)

## 2017-02-23 LAB — VDRL, CSF: VDRL Quant, CSF: NONREACTIVE

## 2017-02-25 LAB — OLIGOCLONAL BANDS, CSF + SERM

## 2017-02-27 LAB — MULTIPLE SCLEROSIS PANEL 2
Albumin, CSF: 20.8 mg/dL (ref 8.0–42.0)
Albumin, Serum(Neph): 3.8 g/dL (ref 3.5–5.2)
IGG INDEX, CSF: 1.03 — AB (ref <0.66)
IGG, CSF: 5.1 mg/dL (ref 0.8–7.7)
IMMUNOGLOBULIN G FD: 903 mg/dL (ref 694–1618)
MS CNS IGG SYNTHESIS RATE: 11.1 mg/(24.h) — AB (ref <=3.3)
Myelin Basic Protein: <2 mcg/L (ref 2.0–4.0)

## 2017-03-01 ENCOUNTER — Ambulatory Visit (INDEPENDENT_AMBULATORY_CARE_PROVIDER_SITE_OTHER): Payer: BLUE CROSS/BLUE SHIELD | Admitting: Neurology

## 2017-03-01 ENCOUNTER — Encounter: Payer: Self-pay | Admitting: Neurology

## 2017-03-01 ENCOUNTER — Telehealth: Payer: Self-pay | Admitting: Neurology

## 2017-03-01 ENCOUNTER — Encounter (INDEPENDENT_AMBULATORY_CARE_PROVIDER_SITE_OTHER): Payer: Self-pay

## 2017-03-01 VITALS — BP 129/80 | HR 97 | Resp 16 | Ht 61.0 in | Wt 188.0 lb

## 2017-03-01 DIAGNOSIS — G8929 Other chronic pain: Secondary | ICD-10-CM

## 2017-03-01 DIAGNOSIS — G4700 Insomnia, unspecified: Secondary | ICD-10-CM | POA: Diagnosis not present

## 2017-03-01 DIAGNOSIS — R51 Headache: Secondary | ICD-10-CM | POA: Diagnosis not present

## 2017-03-01 DIAGNOSIS — R269 Unspecified abnormalities of gait and mobility: Secondary | ICD-10-CM | POA: Diagnosis not present

## 2017-03-01 DIAGNOSIS — G35 Multiple sclerosis: Secondary | ICD-10-CM | POA: Insufficient documentation

## 2017-03-01 NOTE — Telephone Encounter (Signed)
Patient called office returning Dr. Bonnita Hollow call.  Please call

## 2017-03-01 NOTE — Telephone Encounter (Signed)
I have spoken with Hannah Andersen this morning.  Per RAS, labs are consistent with MS.  Appt. offered 3pm today to discuss tx. options., but pt. not able to come in today. Appt. given 1330 on 03/06/17/fim

## 2017-03-01 NOTE — Progress Notes (Signed)
[facility]  PATIENT: Hannah Andersen DOB: 31-Aug-1971  REFERRING DOCTOR OR PCP:  Dr. Jobe Marker SOURCE: patient, notes from ED/Dr. Trudie Buckler, studies at [facility]  _________________________________   HISTORICAL  CHIEF COMPLAINT:  Chief Complaint  Patient presents with  . Multiple Sclerosis    Here to discuss lab results and tx. options for MS/fim    HISTORY OF PRESENT ILLNESS:  Hannah Andersen is a 46 year old woman who had an episode of syncope in 2017 and an abnormal MRI concerning for MS.  Last week, she had a lumbar puncture. CSF was abnormal with > 5 oligoclonal bands and an elevated IgG index consistent with multiple sclerosis.  HA:   Headaches are better with imipramine.    They occur less frequently and are less intense.   When present, they are in the temples and a little further back, sometimes to the occiput.  Most of time, she will get nausea with the headaches and every now and then she will have vomiting.     Bright lights, heat and stress worsen the headache.   Moving increases pain.    Bending over especially increases pain.   She does not get much benefit from NSAIDs. Fioricet made her sleepy but did not help pain.     Gait/strength/sensation:   She notes her gait has been worse the past few years.    Specifically, balance is off and she bumps in to the walls.   She denies weakness,   However, she has numbness in the left arm.   This started one year ago,  last June or July, and seemed to come on over one day.   It was worse for a few weeks and involved the entire hand but has been milder since.    She has milder intermittent right hand numbness.   Vision:  A few months ago, she noted more difficulty with visual acuity.   This is only slightly improved with reading glasses.   Peripheral vision seems worse.    Bladder:  She notes occasional urinary urgency and frequency and has rare incontinence with coughing/sneezing.  She has  nocturia only once a night.  Fatigue/sleep:   She notes fatigue as before, worse when she gets a headache,   She has insomnia, especially with sleep maintenance, helped by imipramine (getting 1-2 hours more sleep revery night)  Mood: Depression and anxiety is better on sertraline. She tolerates it well.  History of abnl MRI and syncope:  She was experiencing a headache and then had LOC.  The syncope was not preceded by lightheadedness.   HEENT to what witnesses have told her, she was unconscious for about 1 minute and then was groggy or not quite herself for the next 5 minutes. By the time she got to [facility] she felt that she was back to herself. In the emergency room, she had a head CT scan showing a hypodense focus in the right periatrial white matter. As admitted for further evaluation. An MRI of the brain showed that focus plus many others, some in the periventricular white matter. None of the foci enhanced after contrast. She also had an EEG that did not show any definite epileptiform activity.  Data:    At the initial consultation, I reviewed hospital records from October:    I personally reviewed the MRI of the brain and note one large right periatrial white matter focus, many other periventricular foci one subtle posterior pons on the left.  None of  the foci enhanced. CT scan of the head showed the right periatrial focus. Echocardiogram showed moderate mitral regurgitation and normal ejection fraction carotid Dopplers were normal. EEG showed no epileptiform activity though there were some transient temporal slow waves (nonspecific).      REVIEW OF SYSTEMS: Constitutional: No fevers, chills, sweats, or change in appetite Eyes: No visual changes, double vision, eye pain Ear, nose and throat: No hearing loss, ear pain, nasal congestion, sore throat Cardiovascular: No chest pain, palpitations Respiratory: No shortness of breath at rest or with exertion.   No  wheezes GastrointestinaI: No nausea, vomiting, diarrhea, abdominal pain, fecal incontinence Genitourinary: No dysuria, urinary retention or frequency.  No nocturia. Musculoskeletal: No neck pain, back pain Integumentary: No rash, pruritus, skin lesions Neurological: as above Psychiatric: No depression at this time.  No anxiety Endocrine: No palpitations, diaphoresis, change in appetite, change in weigh or increased thirst Hematologic/Lymphatic: No anemia, purpura, petechiae. Allergic/Immunologic: No itchy/runny eyes, nasal congestion, recent allergic reactions, rashes  ALLERGIES: Allergies  Allergen Reactions  . Lexapro [Escitalopram Oxalate] Hives    HOME MEDICATIONS:  Current Outpatient Prescriptions:  .  atorvastatin (LIPITOR) 10 MG tablet, Take 1 tablet (10 mg total) by mouth daily., Disp: 30 tablet, Rfl: 11 .  butalbital-acetaminophen-caffeine (FIORICET, ESGIC) 50-325-40 MG tablet, Take by mouth., Disp: , Rfl:  .  cyanocobalamin (TH VITAMIN B12) 100 MCG tablet, Take by mouth., Disp: , Rfl:  .  escitalopram (LEXAPRO) 20 MG tablet, Take 1 tablet (20 mg total) by mouth daily., Disp: 30 tablet, Rfl: 5 .  FOLBEE 2.5-25-1 MG TABS tablet, TAKE ONE TABLET BY MOUTH ONCE DAILY, Disp: 30 tablet, Rfl: 11 .  imipramine (TOFRANIL) 25 MG tablet, Take 1 tablet (25 mg total) by mouth at bedtime., Disp: 30 tablet, Rfl: 5 .  omeprazole (PRILOSEC) 10 MG capsule, Take by mouth., Disp: , Rfl:  .  SUMAtriptan (IMITREX) 100 MG tablet, Take 1 tablet (100 mg total) by mouth once as needed for migraine. May repeat in 2 hours if headache persists or recurs., Disp: 10 tablet, Rfl: 2 .  valsartan (DIOVAN) 80 MG tablet, TAKE ONE TABLET BY MOUTH ONCE DAILY, Disp: 30 tablet, Rfl: 1 .  varenicline (CHANTIX PAK) 0.5 MG X 11 & 1 MG X 42 tablet, Take one 0.5 mg tab by mouth once daily for 3 days, increase to one 0.5 mg tab twice daily for 4 days, increase to 1 mg tab twice daily., Disp: , Rfl:  .  varenicline  (CHANTIX) 1 MG tablet, Take 1 mg by mouth., Disp: , Rfl:   PAST MEDICAL HISTORY: Past Medical History:  Diagnosis Date  . Asthma   . Syncope and collapse   . Vision abnormalities     PAST SURGICAL HISTORY: Past Surgical History:  Procedure Laterality Date  . DILATION AND CURETTAGE OF UTERUS    . TUBAL LIGATION      FAMILY HISTORY: Family History  Problem Relation Age of Onset  . Diabetes Mellitus II Mother   . Arrhythmia Mother   . Diabetes type I Father   . COPD Father   . Obstructive Sleep Apnea Father   . Depression Father   . GER disease Father     SOCIAL HISTORY:  Social History   Social History  . Marital status: Married    Spouse name: N/A  . Number of children: N/A  . Years of education: N/A   Occupational History  . Not on file.   Social History Main Topics  . Smoking  status: Current Every Day Smoker    Types: Cigarettes  . Smokeless tobacco: Never Used  . Alcohol use No  . Drug use: No  . Sexual activity: Not on file   Other Topics Concern  . Not on file   Social History Narrative  . No narrative on file     PHYSICAL EXAM  Vitals:   03/01/17 1511  BP: 129/80  Pulse: 97  Resp: 16  Weight: 188 lb (85.3 kg)  Height: 5\' 1"  (1.549 m)    Body mass index is 35.52 kg/m.   General: The patient is well-developed and well-nourished and in no acute distress   Neck:    The neck is now nontender   Skin: Extremities are without rash or edema.   Neurologic Exam  Mental status: The patient is alert and oriented x 3 at the time of the examination. The patient has apparent normal recent and remote memory, with an apparently normal attention span and concentration ability.   Speech is normal.  Cranial nerves: Extraocular movements are full.  Facial strength and sensation is normal. Trapezius and sternocleidomastoid strength is normal. No dysarthria is noted.  The tongue is midline, and the patient has symmetric elevation of the soft palate. No  obvious hearing deficits are noted.  Motor:  Muscle bulk is normal.   Tone is normal. Strength is  5 / 5 in all 4 extremities.     Sensory: Sensory testing shows intact touch, temperature and vibration in the arms and legs.    Coordination: Cerebellar testing reveals good finger-nose-finger and heel-to-shin bilaterally.  Gait and station: Station is normal.   Gait is normal. Tandem gait is wide. Romberg is negative.   Reflexes: Deep tendon reflexes are symmetric and normal in arms but she has 3+ DTRs in legs with spread at the knees and 2-3 beats nonsustained clonus.    Plantar responses are flexor.    DIAGNOSTIC DATA (LABS, IMAGING, TESTING) - I reviewed patient records, labs, notes, testing and imaging myself where available.  Lab Results  Component Value Date   WBC 6.1 05/04/2016   HGB 13.3 05/04/2016   HCT 39.2 05/04/2016   MCV 96.8 05/04/2016   PLT 274 05/04/2016      Component Value Date/Time   NA 138 05/04/2016 1930   K 3.8 05/04/2016 1930   CL 106 05/04/2016 1930   CO2 26 05/04/2016 1930   GLUCOSE 104 (H) 05/04/2016 1930   BUN 7 05/04/2016 1930   CREATININE 0.91 05/04/2016 1930   CALCIUM 9.0 05/04/2016 1930   GFRNONAA >60 05/04/2016 1930   GFRAA >60 05/04/2016 1930       ASSESSMENT AND PLAN  Multiple sclerosis (HCC) - Plan: Quantiferon tb gold assay (blood), CBC with Differential/Platelet, Hepatic function panel, Hepatic function panel  Gait disturbance  Chronic intractable headache, unspecified headache type  Insomnia, unspecified type     1.    Long discussion about her recent diagnosis of MS based on the studies and disease modifying medications for MS.   She is most interested in an oral agent and we discussed Tecfidera and Aubagio in more detail. She has she has had moderate GERD and is on reflux medication. For that reason, she would prefer to try Aubagio instead of Tecfidera. Scuffs the risk of liver issues and TB reactivation. We will check some  blood work and send in the service request form to get her started within the next couple weeks.  She understands she needs to get  monthly liver function tests over the next 6 months. 2.   Continue Imipramine 25 mg nightly.  Also continue Zoloft, Diovan and Folic acid.   Imitrex as needed.  3.   She will return to see me in 4 monthss or sooner if there are new or worsening symptoms  30 minutes face-to-face evaluation with greater than one half of the time counseling and coordinating care about her newly diagnosed multiple sclerosis per  Yanette Tripoli A. Epimenio Foot, MD, PhD 03/01/2017, 4:50 PM Certified in Neurology, Clinical Neurophysiology, Sleep Medicine, Pain Medicine and Neuroimaging  [facility] [address] [place], [place] [address] [phone]

## 2017-03-01 NOTE — Telephone Encounter (Signed)
The lumbar puncture results have returned. CSF is consistent with multiple sclerosis with an elevated IgG index and the presence of greater than 5 oligoclonal bands.  I called but got her voicemail and left a message.

## 2017-03-02 LAB — CBC WITH DIFFERENTIAL/PLATELET
BASOS: 0 %
Basophils Absolute: 0 10*3/uL (ref 0.0–0.2)
EOS (ABSOLUTE): 0.2 10*3/uL (ref 0.0–0.4)
EOS: 2 %
HEMATOCRIT: 38.9 % (ref 34.0–46.6)
Hemoglobin: 12.8 g/dL (ref 11.1–15.9)
IMMATURE GRANS (ABS): 0 10*3/uL (ref 0.0–0.1)
IMMATURE GRANULOCYTES: 0 %
LYMPHS: 26 %
Lymphocytes Absolute: 1.8 10*3/uL (ref 0.7–3.1)
MCH: 31 pg (ref 26.6–33.0)
MCHC: 32.9 g/dL (ref 31.5–35.7)
MCV: 94 fL (ref 79–97)
MONOS ABS: 0.5 10*3/uL (ref 0.1–0.9)
Monocytes: 7 %
NEUTROS PCT: 65 %
Neutrophils Absolute: 4.3 10*3/uL (ref 1.4–7.0)
Platelets: 299 10*3/uL (ref 150–379)
RBC: 4.13 x10E6/uL (ref 3.77–5.28)
RDW: 14.2 % (ref 12.3–15.4)
WBC: 6.8 10*3/uL (ref 3.4–10.8)

## 2017-03-02 LAB — HEPATIC FUNCTION PANEL
ALT: 20 IU/L (ref 0–32)
AST: 20 IU/L (ref 0–40)
Albumin: 4.3 g/dL (ref 3.5–5.5)
Alkaline Phosphatase: 98 IU/L (ref 39–117)
BILIRUBIN, DIRECT: 0.06 mg/dL (ref 0.00–0.40)
Total Protein: 6.8 g/dL (ref 6.0–8.5)

## 2017-03-06 ENCOUNTER — Telehealth: Payer: Self-pay | Admitting: *Deleted

## 2017-03-06 ENCOUNTER — Ambulatory Visit: Payer: Self-pay | Admitting: Neurology

## 2017-03-06 LAB — QUANTIFERON IN TUBE
QUANTIFERON MITOGEN VALUE: >10 IU/mL
QUANTIFERON TB AG VALUE: 0.05 IU/mL
QUANTIFERON TB GOLD: NEGATIVE
Quantiferon Nil Value: 0.07 IU/mL

## 2017-03-06 LAB — QUANTIFERON TB GOLD ASSAY (BLOOD)

## 2017-03-06 NOTE — Telephone Encounter (Signed)
LMOM that per RAS, labs are ok for Aubagio.  I faxed Aubagio srf to [facility] this afternoon/fim

## 2017-03-13 ENCOUNTER — Telehealth: Payer: Self-pay | Admitting: *Deleted

## 2017-03-13 NOTE — Telephone Encounter (Signed)
Fax received from [facility] [place]. phone# [phone].  Aubagio approved for dates 03/13/17 thru 09/04/38.  Ref# [id number]

## 2017-03-13 NOTE — Telephone Encounter (Signed)
Aubagio PA completed and faxed to [facility] [place] fax# [phone].  No tried and faileds.  (New dx).Hannah Andersen

## 2017-05-09 ENCOUNTER — Other Ambulatory Visit: Payer: Self-pay | Admitting: *Deleted

## 2017-05-09 ENCOUNTER — Telehealth: Payer: Self-pay | Admitting: Neurology

## 2017-05-09 DIAGNOSIS — G35 Multiple sclerosis: Secondary | ICD-10-CM

## 2017-05-09 DIAGNOSIS — Z79899 Other long term (current) drug therapy: Secondary | ICD-10-CM

## 2017-05-09 NOTE — Telephone Encounter (Signed)
Pt would like a call back to know where are her options to go for her blood work (as a result of being on Aubigio)please call

## 2017-05-11 ENCOUNTER — Other Ambulatory Visit (INDEPENDENT_AMBULATORY_CARE_PROVIDER_SITE_OTHER): Payer: Self-pay

## 2017-05-11 DIAGNOSIS — Z0289 Encounter for other administrative examinations: Secondary | ICD-10-CM

## 2017-05-11 DIAGNOSIS — G35 Multiple sclerosis: Secondary | ICD-10-CM

## 2017-05-11 DIAGNOSIS — Z79899 Other long term (current) drug therapy: Secondary | ICD-10-CM

## 2017-05-12 LAB — HEPATIC FUNCTION PANEL
ALT: 18 IU/L (ref 0–32)
AST: 21 IU/L (ref 0–40)
Albumin: 4.5 g/dL (ref 3.5–5.5)
Alkaline Phosphatase: 88 IU/L (ref 39–117)
Bilirubin Total: <0.2 mg/dL (ref 0.0–1.2)
Bilirubin, Direct: 0.08 mg/dL (ref 0.00–0.40)
Total Protein: 7 g/dL (ref 6.0–8.5)

## 2017-05-15 ENCOUNTER — Telehealth: Payer: Self-pay | Admitting: *Deleted

## 2017-05-15 NOTE — Telephone Encounter (Signed)
-----   Message from Richard A Sater, MD sent at 05/12/2017 12:02 PM EDT ----- Please let the patient know that the lab work is fine.  

## 2017-05-15 NOTE — Telephone Encounter (Signed)
I have spoken with Hannah Andersen today and per RAS, explained lab work done in our office is fine/fim

## 2017-05-16 ENCOUNTER — Ambulatory Visit: Payer: BLUE CROSS/BLUE SHIELD | Admitting: Neurology

## 2017-06-17 IMAGING — CT CT ANGIO CHEST
2 of 10 series · 18 of 46 positions shown · IV contrast (APPLIED)
Comparison: None.

CLINICAL DATA: Chest pain today

EXAM:
CT ANGIOGRAPHY CHEST, ABDOMEN AND PELVIS
TECHNIQUE: Multidetector CT imaging through the chest, abdomen and pelvis was
performed using the standard protocol during bolus administration of
intravenous contrast. Multiplanar reconstructed images and MIPs were
obtained and reviewed to evaluate the vascular anatomy.
CONTRAST:  100 cc Isovue 370

[Series 5: axial arterial · axial · arterial · 0.96mm/px · z∈[-372,+156]mm · 15 of 199 slices shown]
[im 12/199  lung]
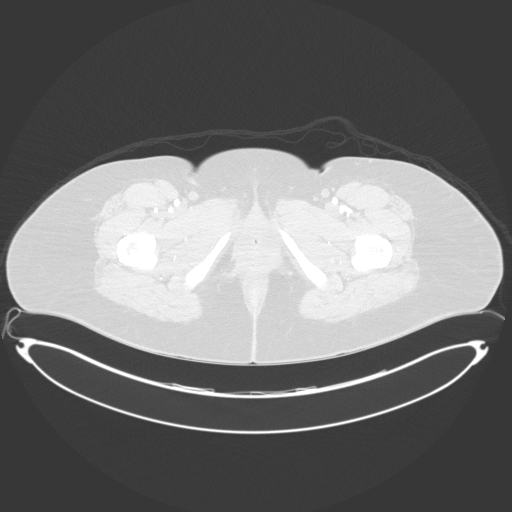
[im 23/199  soft-tissue]
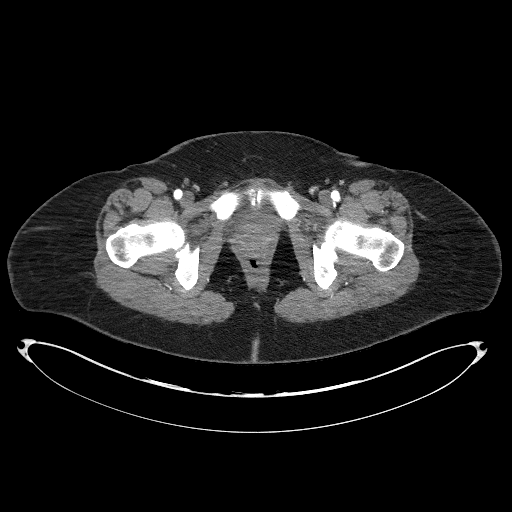
[im 34/199  lung]
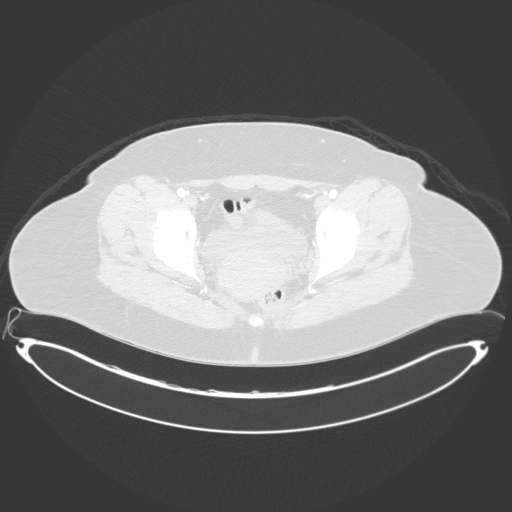
[im 45/199  soft-tissue]
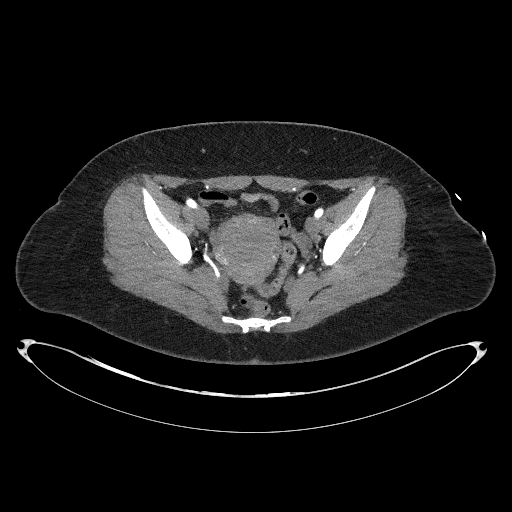
[im 67/199  lung]
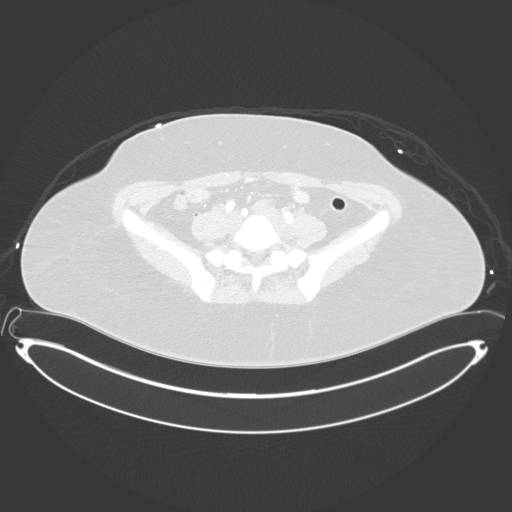
[im 78/199  soft-tissue]
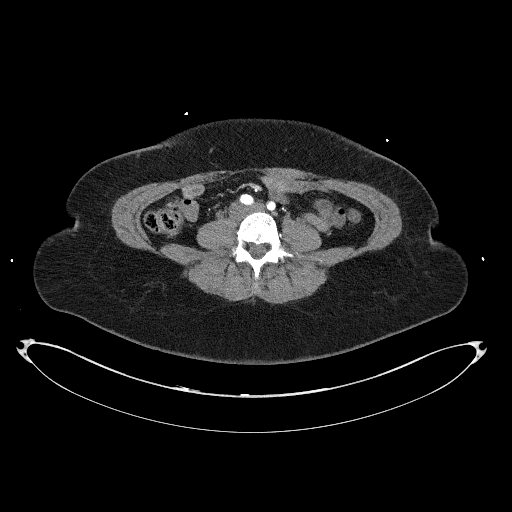
[im 89/199  lung]
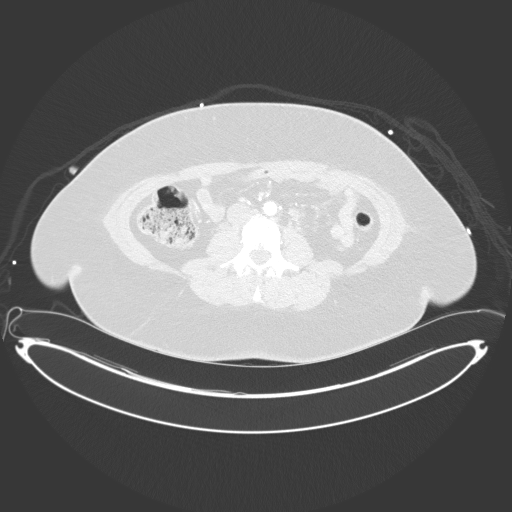
[im 100/199  soft-tissue]
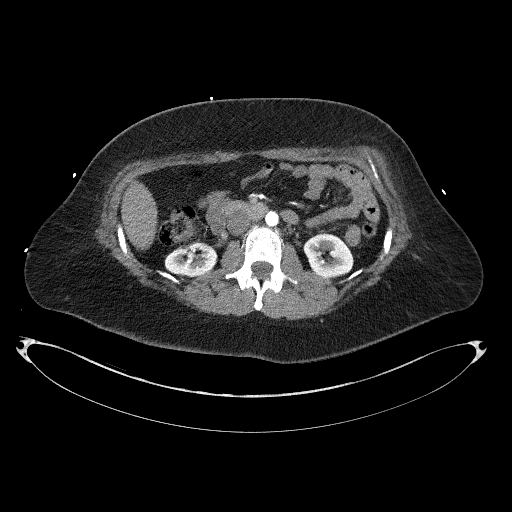
[im 111/199  lung]
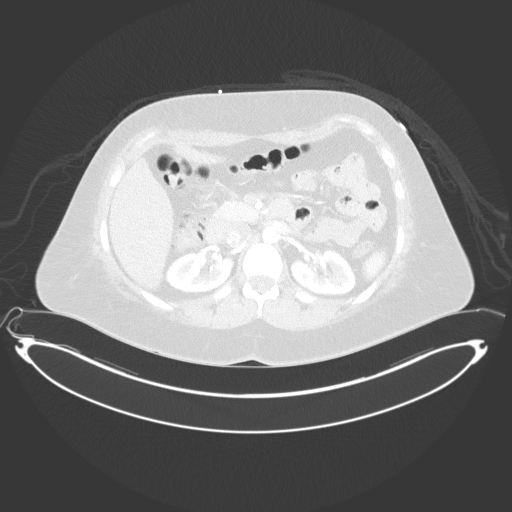
[im 122/199  soft-tissue]
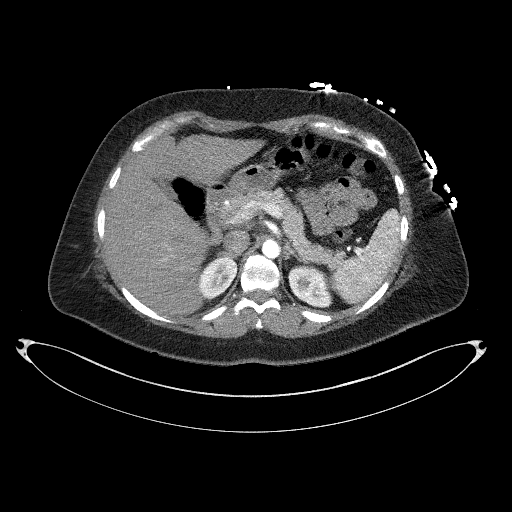
[im 133/199  lung]
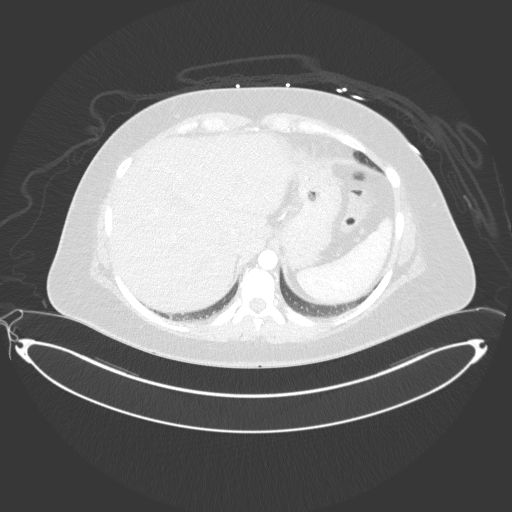
[im 155/199  soft-tissue]
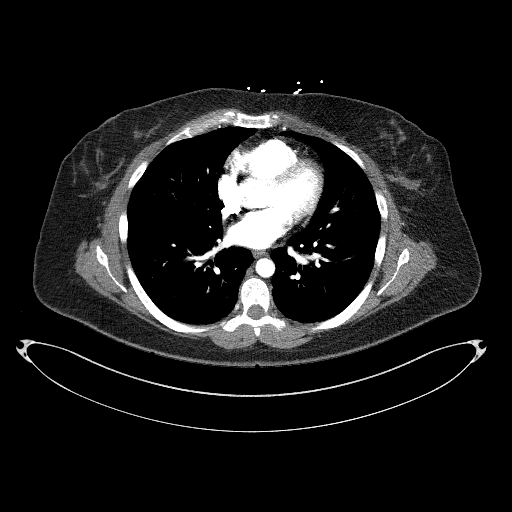
[im 166/199  lung]
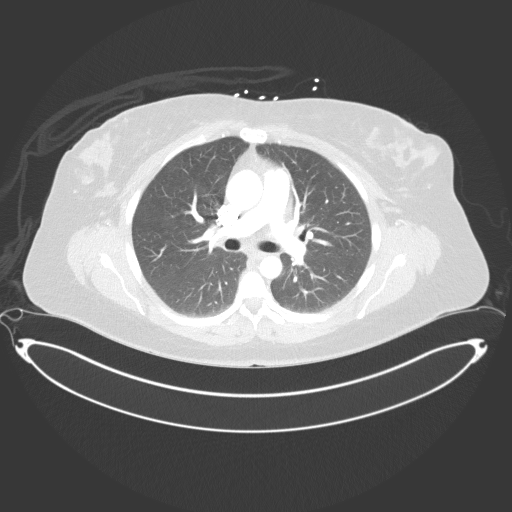
[im 177/199  soft-tissue]
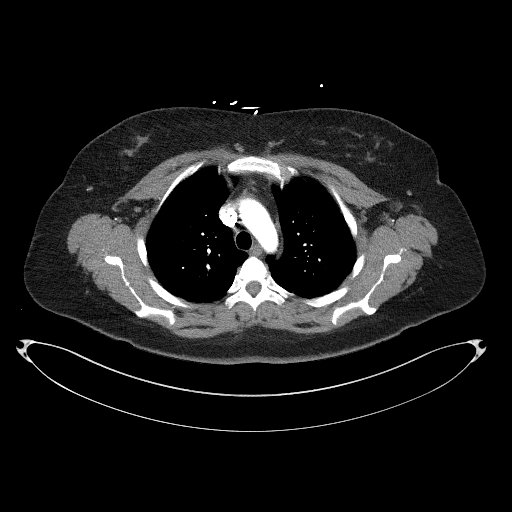
[im 188/199  lung]
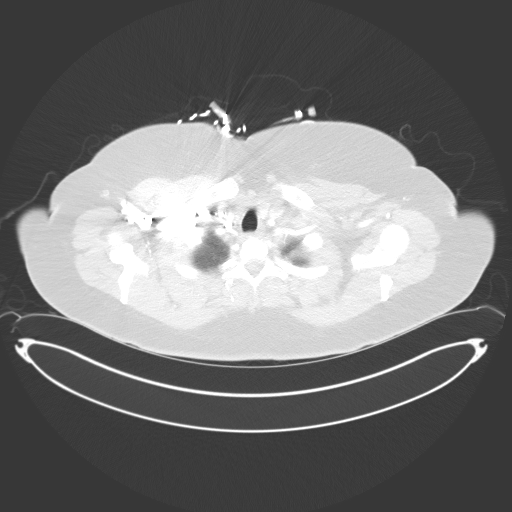

[Series 8: coronals · coronal · 0.95mm/px · 3 of 123 slices shown]
[im 31/123  soft-tissue]
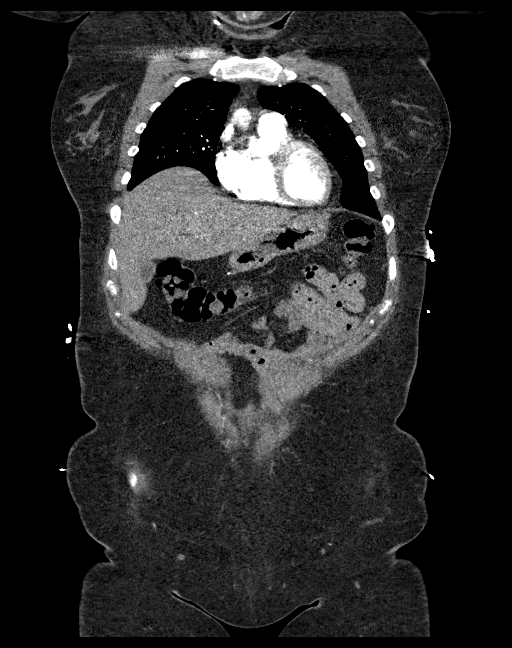
[im 62/123  soft-tissue]
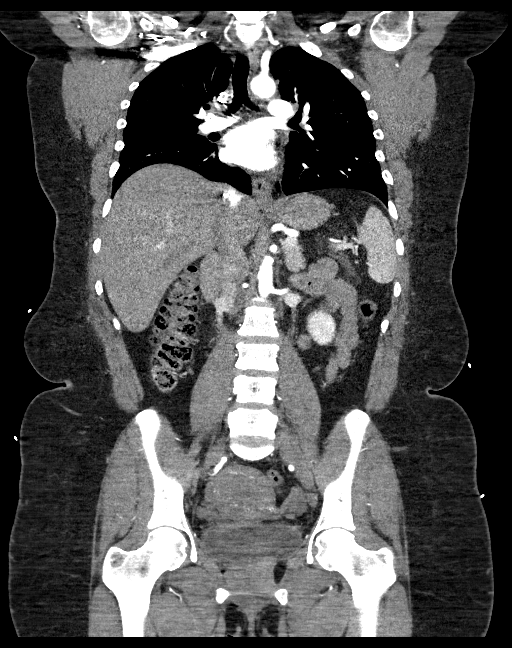
[im 92/123  soft-tissue]
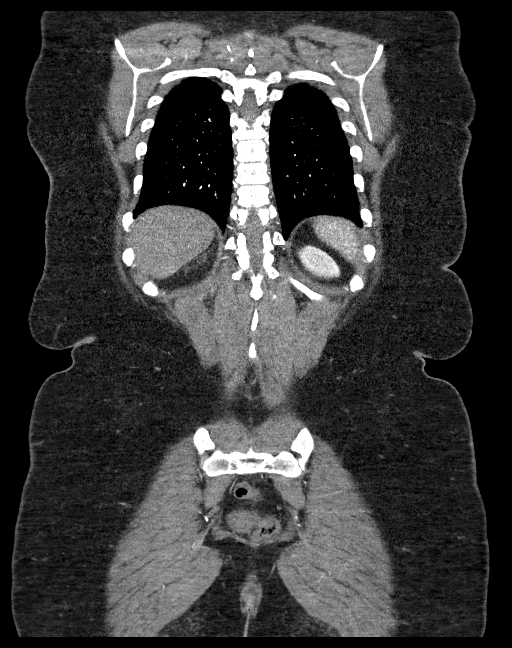

[18 of 46 positions shown; findings below may reference images not displayed]

FINDINGS: CTA CHEST FINDINGS

There is no evidence of aortic dissection, aneurysm, or intramural
hematoma.

Great vessels patent within the confines of the exam.

No obvious acute pulmonary thromboembolism.

Small mediastinal nodes. No abnormal adenopathy by measurement
criteria.

No pneumothorax.  No pleural effusion

No acute bony deformity.

Review of the MIP images confirms the above findings.

CTA ABDOMEN AND PELVIS FINDINGS

No evidence of aortic aneurysm or dissection. The aorta is non
aneurysmal and patent.

Celiac patent

SMA patent

IMA patent

Single renal arteries patent

Common, internal, and external iliac arteries patent

Proximal femoral artery is patent.

Liver, gallbladder, spleen, pancreas, adrenal glands, and kidneys
are within normal limits

Normal appendix. No focal mass of the colon. No evidence of bowel
obstruction.

Uterus is prominent without obvious focal mass. Adnexa and bladder
are within normal limits.

No free-fluid.  No abnormal retroperitoneal adenopathy.

No vertebral compression deformity.

Review of the MIP images confirms the above findings.
IMPRESSION: No of a of aortic dissection.

No active cardiopulmonary disease

No acute intra-abdominal pathology.

## 2017-08-10 ENCOUNTER — Other Ambulatory Visit: Payer: Self-pay | Admitting: Neurology

## 2018-03-15 ENCOUNTER — Other Ambulatory Visit: Payer: Self-pay | Admitting: Neurology

## 2018-03-22 ENCOUNTER — Telehealth: Payer: Self-pay | Admitting: *Deleted

## 2018-03-22 NOTE — Telephone Encounter (Signed)
Called [facility]  And spoke with Alexa (pharmacist) and cancelled rx Augbagio 14mg  tab sent in on 03/16/18. Advised she not been seen in over one year. Needs to be seen by Dr. Epimenio Foot and have follow up labs to make sure it is still safe for her to continue Aubagio. She will make a note in her chart regarding this.

## 2018-03-22 NOTE — Telephone Encounter (Signed)
Called pt. Scheduled yearly f/u for 04/05/18 at 9am. Pt verbalized understanding that she would need to come in for visit to receive refills on Aubagio. She needs labs to makes sure she can continue on med. She will bring insurance cards, copay and updated med list with her to appt.

## 2018-04-05 ENCOUNTER — Encounter: Payer: Self-pay | Admitting: Neurology

## 2018-04-05 ENCOUNTER — Telehealth: Payer: Self-pay | Admitting: Neurology

## 2018-04-05 ENCOUNTER — Ambulatory Visit (INDEPENDENT_AMBULATORY_CARE_PROVIDER_SITE_OTHER): Payer: BLUE CROSS/BLUE SHIELD | Admitting: Neurology

## 2018-04-05 VITALS — BP 130/90 | HR 97 | Ht 61.0 in | Wt 178.0 lb

## 2018-04-05 DIAGNOSIS — R269 Unspecified abnormalities of gait and mobility: Secondary | ICD-10-CM | POA: Diagnosis not present

## 2018-04-05 DIAGNOSIS — G43009 Migraine without aura, not intractable, without status migrainosus: Secondary | ICD-10-CM | POA: Diagnosis not present

## 2018-04-05 DIAGNOSIS — F418 Other specified anxiety disorders: Secondary | ICD-10-CM | POA: Diagnosis not present

## 2018-04-05 DIAGNOSIS — G4700 Insomnia, unspecified: Secondary | ICD-10-CM

## 2018-04-05 DIAGNOSIS — G35 Multiple sclerosis: Secondary | ICD-10-CM

## 2018-04-05 MED ORDER — SUMATRIPTAN SUCCINATE 100 MG PO TABS: 100.0000 mg | ORAL_TABLET | Freq: Once | ORAL | 5 refills | Status: AC | PRN

## 2018-04-05 MED ORDER — TERIFLUNOMIDE 14 MG PO TABS: 1.0000 | ORAL_TABLET | Freq: Every day | ORAL | 12 refills | Status: AC

## 2018-04-05 NOTE — Telephone Encounter (Signed)
Hannah Andersen [phone] (05/04/18). Called patient and left message with her husband to call me back. DW

## 2018-04-05 NOTE — Progress Notes (Signed)
[facility]  PATIENT: Hannah Andersen DOB: [birth year]  REFERRING DOCTOR OR PCP:  Dr. Jobe Marker SOURCE: patient, notes from ED/Dr. Trudie Buckler, studies at [facility]  _________________________________   HISTORICAL  CHIEF COMPLAINT:  Chief Complaint  Patient presents with  . Follow-up  . Multiple Sclerosis    having some dizziness, tolerating aubagio ok.    HISTORY OF PRESENT ILLNESS:  Hannah Andersen is a 47 year old woman who was diagnosed with MS in 2017.  Update 04/05/2018: She feels mostly stable.    She is on Aubagio and tolerates it well.    She notes no change in gait, strength and sensation.      She is still bumping into walls to the right as she walks but has no falls..      Bladder function is fine.    She feels her vision is off -- reading is more difficult and she needed to get reading glasses.  She feels her energy level is reduced.    She is sleeping better since starting Cymbalta.   She also feels it has helped her mild depression and anxiety..    She notes she is sometimes forgetful and has reduced focus and attention.  Headaches are doing about the same.  She gets about 2-3 a month that are severe and gets milder ones twice a week.  With the severe ones, she has nausea, photo/phonophobia and feels better if she rests in a cold dark room and worse if she is moving.     An ice pack and laying down helps her sleep and the headache is usually gone when she wakes up.     She is taking a Vit D supplement and feels less tires since starting.    After the last visit, she had an MRI of the cervical spine.  It does show a probable small focus to the right adjacent to C3.  She also has some degenerative changes at C5-C6 and C6-C7.   From 02/28/2017: Hannah Andersen is a 47 year old woman who had an episode of syncope in 2017 and an abnormal MRI concerning for MS.  Last week, she had a lumbar puncture. CSF was abnormal with > 5  oligoclonal bands and an elevated IgG index consistent with multiple sclerosis.  HA:   Headaches are better with imipramine.    They occur less frequently and are less intense.   When present, they are in the temples and a little further back, sometimes to the occiput.  Most of time, she will get nausea with the headaches and every now and then she will have vomiting.     Bright lights, heat and stress worsen the headache.   Moving increases pain.    Bending over especially increases pain.   She does not get much benefit from NSAIDs. Fioricet made her sleepy but did not help pain.     Gait/strength/sensation:   She notes her gait has been worse the past few years.    Specifically, balance is off and she bumps in to the walls.   She denies weakness,   However, she has numbness in the left arm.   This started one year ago,  last June or July, and seemed to come on over one day.   It was worse for a few weeks and involved the entire hand but has been milder since.    She has milder intermittent right hand numbness.   Vision:  A few months ago, she noted more difficulty with visual  acuity.   This is only slightly improved with reading glasses.   Peripheral vision seems worse.    Bladder:  She notes occasional urinary urgency and frequency and has rare incontinence with coughing/sneezing.  She has nocturia only once a night.  Fatigue/sleep:   She notes fatigue as before, worse when she gets a headache,   She has insomnia, especially with sleep maintenance, helped by imipramine (getting 1-2 hours more sleep revery night)  Mood: Depression and anxiety is better on sertraline. She tolerates it well.  History of abnl MRI and syncope:  She was experiencing a headache and then had LOC.  The syncope was not preceded by lightheadedness.   HEENT to what witnesses have told her, she was unconscious for about 1 minute and then was groggy or not quite herself for the next 5 minutes. By the time she got to [facility] she felt that she was back to herself. In the emergency room, she had a head CT scan showing a hypodense focus in the right periatrial white matter. As admitted for further evaluation. An MRI of the brain showed that focus plus many others, some in the periventricular white matter. None of the foci enhanced after contrast. She also had an EEG that did not show any definite epileptiform activity.  Data:    At the initial consultation, I reviewed hospital records from October:    I personally reviewed the MRI of the brain and note one large right periatrial white matter focus, many other periventricular foci one subtle posterior pons on the left.  None of the foci enhanced. CT scan of the head showed the right periatrial focus. Echocardiogram showed moderate mitral regurgitation and normal ejection fraction carotid Dopplers were normal. EEG showed no epileptiform activity though there were some transient temporal slow waves (nonspecific).      REVIEW OF SYSTEMS: Constitutional: No fevers, chills, sweats, or change in appetite Eyes: No visual changes, double vision, eye pain Ear, nose and throat: No hearing loss, ear pain, nasal congestion, sore throat Cardiovascular: No chest pain, palpitations Respiratory: No shortness of breath at rest or with exertion.   No wheezes GastrointestinaI: No nausea, vomiting, diarrhea, abdominal pain, fecal incontinence Genitourinary: No dysuria, urinary retention or frequency.  No nocturia. Musculoskeletal: No neck pain, back pain Integumentary: No rash, pruritus, skin lesions Neurological: as above Psychiatric: No depression at this time.  No anxiety Endocrine: No palpitations, diaphoresis, change in appetite, change in weigh or increased thirst Hematologic/Lymphatic: No anemia, purpura, petechiae. Allergic/Immunologic: No itchy/runny eyes, nasal congestion, recent allergic reactions, rashes  ALLERGIES: Allergies  Allergen Reactions  .  Lexapro [Escitalopram Oxalate] Hives    HOME MEDICATIONS:  Current Outpatient Medications:  .  aspirin EC 81 MG tablet, Take 81 mg by mouth daily., Disp: , Rfl:  .  atorvastatin (LIPITOR) 10 MG tablet, Take 1 tablet (10 mg total) by mouth daily., Disp: 30 tablet, Rfl: 11 .  Cholecalciferol (VITAMIN D-1000 MAX ST) 1000 units tablet, Take by mouth daily. , Disp: , Rfl:  .  DULoxetine (CYMBALTA) 60 MG capsule, Take 60 mg by mouth daily. , Disp: , Rfl:  .  FOLBEE 2.5-25-1 MG TABS tablet, TAKE ONE TABLET BY MOUTH ONCE DAILY, Disp: 30 tablet, Rfl: 11 .  losartan (COZAAR) 50 MG tablet, Take 50 mg by mouth daily., Disp: , Rfl:  .  omeprazole (PRILOSEC) 10 MG capsule, Take by mouth., Disp: , Rfl:  .  Teriflunomide (AUBAGIO) 14 MG TABS, Take 1 tablet  by mouth daily., Disp: 30 tablet, Rfl: 12 .  vitamin B-12 (CYANOCOBALAMIN) 1000 MCG tablet, Take 500 mcg by mouth daily. , Disp: , Rfl:  .  SUMAtriptan (IMITREX) 100 MG tablet, Take 1 tablet (100 mg total) by mouth once as needed for up to 1 dose for migraine. May repeat in 2 hours if headache persists or recurs., Disp: 10 tablet, Rfl: 5  PAST MEDICAL HISTORY: Past Medical History:  Diagnosis Date  . Asthma   . Syncope and collapse   . Vision abnormalities     PAST SURGICAL HISTORY: Past Surgical History:  Procedure Laterality Date  . DILATION AND CURETTAGE OF UTERUS    . TUBAL LIGATION      FAMILY HISTORY: Family History  Problem Relation Age of Onset  . Diabetes Mellitus II Mother   . Arrhythmia Mother   . Diabetes type I Father   . COPD Father   . Obstructive Sleep Apnea Father   . Depression Father   . GER disease Father     SOCIAL HISTORY:  Social History   Socioeconomic History  . Marital status: Married    Spouse name: Not on file  . Number of children: Not on file  . Years of education: Not on file  . Highest education level: Not on file  Occupational History  . Not on file  Social Needs  . Financial resource  strain: Not on file  . Food insecurity:    Worry: Not on file    Inability: Not on file  . Transportation needs:    Medical: Not on file    Non-medical: Not on file  Tobacco Use  . Smoking status: Current Every Day Smoker    Types: Cigarettes  . Smokeless tobacco: Never Used  Substance and Sexual Activity  . Alcohol use: No  . Drug use: No  . Sexual activity: Not on file  Lifestyle  . Physical activity:    Days per week: Not on file    Minutes per session: Not on file  . Stress: Not on file  Relationships  . Social connections:    Talks on phone: Not on file    Gets together: Not on file    Attends religious service: Not on file    Active member of club or organization: Not on file    Attends meetings of clubs or organizations: Not on file    Relationship status: Not on file  . Intimate partner violence:    Fear of current or ex partner: Not on file    Emotionally abused: Not on file    Physically abused: Not on file    Forced sexual activity: Not on file  Other Topics Concern  . Not on file  Social History Narrative  . Not on file     PHYSICAL EXAM  Vitals:   04/05/18 0901  BP: 130/90  Pulse: 97  Weight: 178 lb (80.7 kg)  Height: 5\' 1"  (1.549 m)    Body mass index is 33.63 kg/m.   General: The patient is well-developed and well-nourished and in no acute distress     Skin: Extremities are without rash or edema.   Neurologic Exam  Mental status: The patient is alert and oriented x 3 at the time of the examination. The patient has apparent normal recent and remote memory, with an apparently normal attention span and concentration ability.   Speech is normal.  Cranial nerves: Extraocular movements are full.  Facial strength and sensation is normal.  Trapezius strength is normal..  The tongue is midline, and the patient has symmetric elevation of the soft palate. No obvious hearing deficits are noted.  Motor:  Muscle bulk is normal.   Tone is normal.  Strength is  5 / 5 in all 4 extremities.     Sensory: Sensory testing shows intact touch, temperature and vibration in the arms and legs.    Coordination: Cerebellar testing shows good finger-nose-finger and heel-to-shin bilaterally.  Gait and station: Station is normal.  Gait is fairly normal but the tandem gait is wide.  The Romberg is negative.   Reflexes:   She has increased reflexes in the legs with crossed abductor responses at the knees and a couple beats of nonsustained clonus at the ankles.        DIAGNOSTIC DATA (LABS, IMAGING, TESTING) - I reviewed patient records, labs, notes, testing and imaging myself where available.  Lab Results  Component Value Date   WBC 6.8 03/01/2017   HGB 12.8 03/01/2017   HCT 38.9 03/01/2017   MCV 94 03/01/2017   PLT 299 03/01/2017      Component Value Date/Time   NA 138 05/04/2016 1930   K 3.8 05/04/2016 1930   CL 106 05/04/2016 1930   CO2 26 05/04/2016 1930   GLUCOSE 104 (H) 05/04/2016 1930   BUN 7 05/04/2016 1930   CREATININE 0.91 05/04/2016 1930   CALCIUM 9.0 05/04/2016 1930   PROT 7.0 05/11/2017 1135   ALBUMIN 4.5 05/11/2017 1135   AST 21 05/11/2017 1135   ALT 18 05/11/2017 1135   ALKPHOS 88 05/11/2017 1135   BILITOT <0.2 05/11/2017 1135   GFRNONAA >60 05/04/2016 1930   GFRAA >60 05/04/2016 1930       ASSESSMENT AND PLAN  Gait disturbance  Multiple sclerosis (HCC) - Plan: MR BRAIN W WO CONTRAST  Common migraine without intractability  Depression with anxiety  Insomnia, unspecified type     1.     She will continue Aubagio.  She has recent normal blood work from 3 weeks ago with her primary care doctor (Dr. Trinna Post [facility])  2.   She will continue Cymbalta.   3.    Sumatriptan as needed for severe headache.  No more than 2 a day for 14-month.   4.   She will return to see me in a year or sooner if there are new or worsening symptoms   Richad Ramsay A. Epimenio Foot, MD, PhD 04/05/2018, 9:43 AM Certified in Neurology,  Clinical Neurophysiology, Sleep Medicine, Pain Medicine and Neuroimaging  [facility] [address] [place], [place] [address] [phone]

## 2018-04-07 IMAGING — XA DG FLUORO GUIDE NDL PLC/BX
2 series · 2 of 2 positions shown · non-contrast
Comparison: none

CLINICAL DATA: Possible multiple sclerosis, abnormal white matter
lesions on MRI of brain with and cervical spine. Syncope and
fatigue.

[Series 1: ortho adipose · 1 of 1 slices shown (1 of 2)]
[im 1/1]
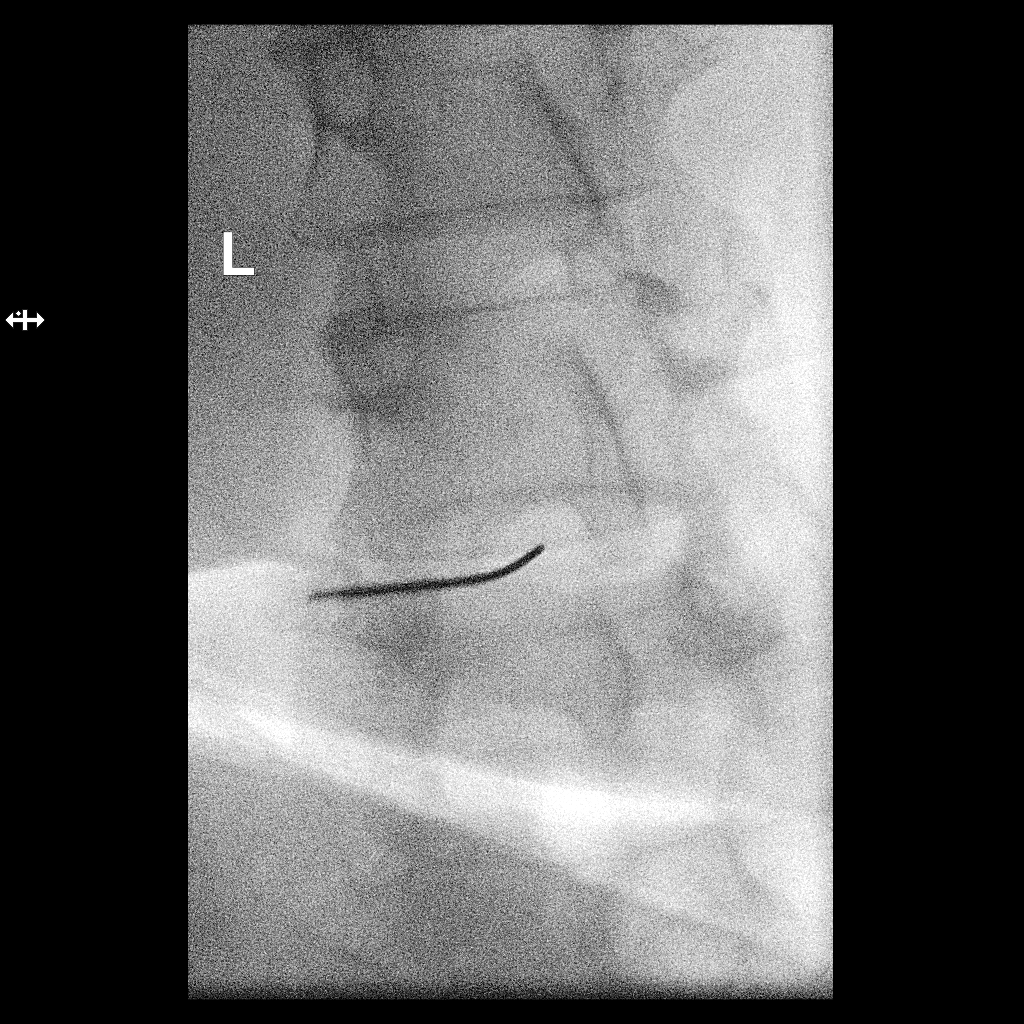

[Series 2: ortho adipose · 1 of 1 slices shown (2 of 2)]
[im 1/1]
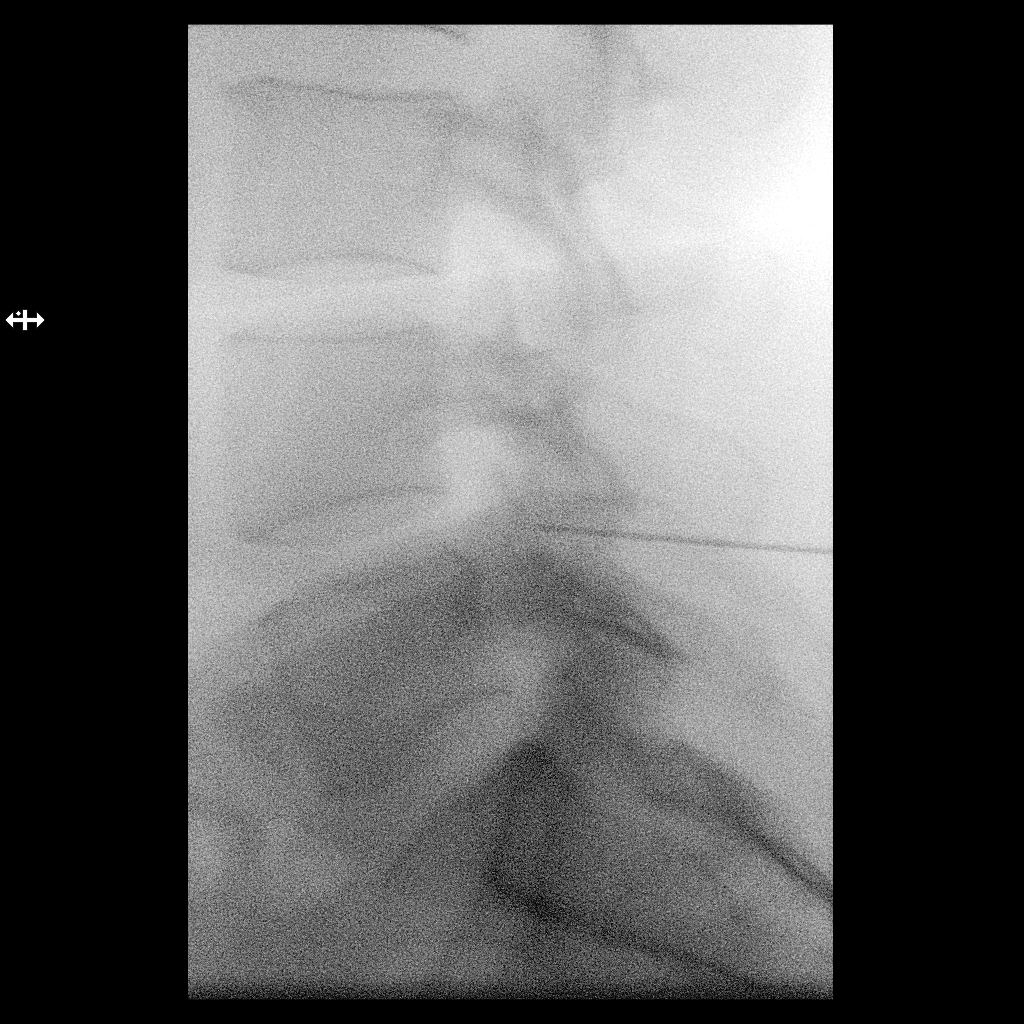

[2 of 2 positions shown; findings below may reference images not displayed]

EXAM:
DIAGNOSTIC LUMBAR PUNCTURE UNDER FLUOROSCOPIC GUIDANCE

FLUOROSCOPY TIME:  22 seconds corresponding to a Dose Area Product
of 83.08 ?Gy*m2

PROCEDURE:
Informed consent was obtained from the patient prior to the
procedure, including potential complications of headache, allergy,
and pain. Time-out was performed.

With the patient lateral decubitus, the lower back was prepped with
Betadine. 1% Lidocaine was used for local anesthesia. Lumbar
puncture was performed at the L4-5 level using a 6 inch, 20 gauge
needle with return of clear CSF with an opening pressure of 12.0 cm
water. 12.0 ml of CSF were obtained for laboratory studies. The
patient tolerated the procedure well and there were no apparent
complications.

Patient was given instructions for strict bed rest for 24 hours.
IMPRESSION: Technically successful diagnostic lumbar puncture at L4-5. Clear
colorless fluid with normal pressure. Fluid was sent for laboratory
studies per request of ordering provider.

## 2018-05-05 ENCOUNTER — Ambulatory Visit
Admission: RE | Admit: 2018-05-05 | Discharge: 2018-05-05 | Disposition: A | Discharge status: Home / Self Care | Payer: BLUE CROSS/BLUE SHIELD | Source: Ambulatory Visit | Attending: Neurology | Admitting: Neurology

## 2018-05-05 DIAGNOSIS — G35 Multiple sclerosis: Secondary | ICD-10-CM

## 2018-05-05 MED ORDER — GADOBENATE DIMEGLUMINE 529 MG/ML IV SOLN
15.0000 mL | Freq: Once | INTRAVENOUS | Status: AC | PRN
  Administered 2018-05-05: 15 mL via INTRAVENOUS

## 2018-05-08 ENCOUNTER — Telehealth: Payer: Self-pay | Admitting: *Deleted

## 2018-05-08 NOTE — Telephone Encounter (Signed)
LMOM with below MRI results.  Please call with any questions/fim 

## 2018-05-08 NOTE — Telephone Encounter (Signed)
-----   Message from Asa Lente, MD sent at 05/07/2018  2:35 PM EDT ----- Please let her know I looked at the MRI and compared it to her MRI from 2017.  There are no brand new MS lesions though there are about 4 more lesions in 2019 compared to 2017 --- since she started treatment in 2018, they likely occurred before she was on medications.

## 2018-09-17 ENCOUNTER — Telehealth: Payer: Self-pay | Admitting: *Deleted

## 2018-09-17 NOTE — Telephone Encounter (Signed)
PA Aubagio 14mg  tablet initiated on covermymeds. Key: [id number] - Rx #: [id number]. In process of completing.  PA approved effective from 09/17/2018 through 09/15/2021.\ Faxed notice of approval to [facility] at [phone]. Received fax confirmation.

## 2019-04-08 ENCOUNTER — Ambulatory Visit: Payer: BLUE CROSS/BLUE SHIELD | Admitting: Neurology
